# Patient Record
Sex: Female | Born: 1990
Health system: Southern US, Community
[De-identification: ages and names within clinical notes are randomized; demographics above are authoritative.]

## PROBLEM LIST (undated history)

## (undated) DIAGNOSIS — E785 Hyperlipidemia, unspecified: Secondary | ICD-10-CM

## (undated) DIAGNOSIS — E559 Vitamin D deficiency, unspecified: Secondary | ICD-10-CM

## (undated) HISTORY — PX: WISDOM TOOTH EXTRACTION: SHX21

## (undated) HISTORY — DX: Hyperlipidemia, unspecified: E78.5

---

## 2013-03-23 ENCOUNTER — Emergency Department (HOSPITAL_COMMUNITY): Payer: Worker's Compensation

## 2013-03-23 ENCOUNTER — Encounter (HOSPITAL_COMMUNITY): Payer: Self-pay | Admitting: Emergency Medicine

## 2013-03-23 ENCOUNTER — Emergency Department (HOSPITAL_COMMUNITY)
Admission: EM | Admit: 2013-03-23 | Discharge: 2013-03-23 | Disposition: A | Discharge status: Home / Self Care | Payer: Worker's Compensation | Attending: Emergency Medicine | Admitting: Emergency Medicine

## 2013-03-23 DIAGNOSIS — IMO0001 Reserved for inherently not codable concepts without codable children: Secondary | ICD-10-CM | POA: Insufficient documentation

## 2013-03-23 DIAGNOSIS — W5501XA Bitten by cat, initial encounter: Secondary | ICD-10-CM

## 2013-03-23 DIAGNOSIS — Y9389 Activity, other specified: Secondary | ICD-10-CM | POA: Insufficient documentation

## 2013-03-23 DIAGNOSIS — S61209A Unspecified open wound of unspecified finger without damage to nail, initial encounter: Secondary | ICD-10-CM | POA: Insufficient documentation

## 2013-03-23 DIAGNOSIS — Y990 Civilian activity done for income or pay: Secondary | ICD-10-CM | POA: Insufficient documentation

## 2013-03-23 DIAGNOSIS — Y9289 Other specified places as the place of occurrence of the external cause: Secondary | ICD-10-CM | POA: Insufficient documentation

## 2013-03-23 MED ORDER — AMOXICILLIN-POT CLAVULANATE 875-125 MG PO TABS: 1.0000 | ORAL_TABLET | Freq: Two times a day (BID) | ORAL | Status: DC

## 2013-03-23 MED ORDER — TRAMADOL HCL 50 MG PO TABS: 50.0000 mg | ORAL_TABLET | Freq: Four times a day (QID) | ORAL | Status: DC | PRN

## 2013-03-23 MED ORDER — IBUPROFEN 800 MG PO TABS
800.0000 mg | ORAL_TABLET | Freq: Once | ORAL | Status: AC
  Administered 2013-03-23: 800 mg via ORAL
  Filled 2013-03-23: qty 1

## 2013-03-23 MED ORDER — AMOXICILLIN-POT CLAVULANATE 875-125 MG PO TABS
1.0000 | ORAL_TABLET | Freq: Once | ORAL | Status: AC
  Administered 2013-03-23: 1 via ORAL
  Filled 2013-03-23: qty 1

## 2013-03-23 NOTE — ED Provider Notes (Signed)
History    This chart was scribed for non-physician practitioner, Marlon Pel PA-C working with Rolan Bucco, MD by Smitty Pluck, ED scribe. This patient was seen in room WTR5/WTR5 and the patient's care was started at 6:51 PM.   CSN: [id number]  Arrival date & time 03/23/13  1819      Chief Complaint  Patient presents with  . Animal Bite     The history is provided by the patient. No language interpreter was used.   Olivia Gardner is a 22 y.o. female who presents to the Emergency Department complaining of cat bite to right 1st and 2nd digit today. She reports constant, moderate right finger pain. Pt reports that she works at [employer] and she was working with a cat that bite her during a procedure. She reports that the cat does not have any vaccinations. The cat has been placed in quarantine for 10 days. Pt denies fever, chills, nausea, vomiting, diarrhea, weakness, cough, SOB and any other pain.    History reviewed. No pertinent past medical history.  History reviewed. No pertinent past surgical history.  No family history on file.  History  Substance Use Topics  . Smoking status: Not on file  . Smokeless tobacco: Not on file  . Alcohol Use: Not on file    OB History   Grav Para Term Preterm Abortions TAB SAB Ect Mult Living                  Review of Systems  Constitutional: Negative for fever and chills.  Respiratory: Negative for shortness of breath.   Gastrointestinal: Negative for nausea and vomiting.  Neurological: Negative for weakness.    Allergies  Review of patient's allergies indicates no known allergies.  Home Medications   Current Outpatient Rx  Name  Route  Sig  Dispense  Refill  . cetirizine (ZYRTEC) 10 MG tablet   Oral   Take 10 mg by mouth daily.         Marland Kitchen etonogestrel-ethinyl estradiol (NUVARING) 0.12-0.015 MG/24HR vaginal ring   Vaginal   Place 1 each vaginally every 28 (twenty-eight) days. Insert vaginally and leave in place for  3 consecutive weeks, then remove for 1 week.         . naproxen sodium (ANAPROX) 220 MG tablet   Oral   Take 220 mg by mouth as needed. Pain         . simethicone (MYLICON) 80 MG chewable tablet   Oral   Chew 80 mg by mouth every 6 (six) hours as needed for flatulence.         Marland Kitchen amoxicillin-clavulanate (AUGMENTIN) 875-125 MG per tablet   Oral   Take 1 tablet by mouth every 12 (twelve) hours.   14 tablet   0   . traMADol (ULTRAM) 50 MG tablet   Oral   Take 1 tablet (50 mg total) by mouth every 6 (six) hours as needed for pain.   15 tablet   0     BP 103/60  Pulse 98  Temp(Src) 98.7 F (37.1 C) (Oral)  SpO2 97%  LMP 03/16/2013  Physical Exam  Nursing note and vitals reviewed. Constitutional: She is oriented to person, place, and time. She appears well-developed and well-nourished. No distress.  HENT:  Head: Normocephalic and atraumatic.  Eyes: EOM are normal.  Neck: Neck supple. No tracheal deviation present.  Cardiovascular: Normal rate.   Pulmonary/Chest: Effort normal. No respiratory distress.  Musculoskeletal: Normal range of motion.  Left hand: She exhibits tenderness (left thumb) and swelling. She exhibits normal range of motion, no bony tenderness, normal two-point discrimination, normal capillary refill, no deformity and no laceration.       Hands: Neurological: She is alert and oriented to person, place, and time.  Skin: Skin is warm and dry.  Psychiatric: She has a normal mood and affect. Her behavior is normal.    ED Course  Procedures (including critical care time) DIAGNOSTIC STUDIES: Oxygen Saturation is 97% on room air, normal by my interpretation.    COORDINATION OF CARE: 6:54 PM Discussed ED treatment with pt and pt agrees to xray, pain medication and abx.   Augmentin, wound soaked in warm soap and water, xray and pain meds  Labs Reviewed - No data to display Dg Finger Thumb Right  03/23/2013  *RADIOLOGY REPORT*  Clinical Data:  Bitten by a cat at tip of thumb  RIGHT THUMB 2+V  Comparison: None  Findings: Bone mineralization normal. Joint spaces preserved. No fracture, dislocation, or bone destruction. No radiopaque foreign body or soft tissue gas.  IMPRESSION: Normal exam.   Original Report Authenticated By: Ulyses Southward, M.D.      1. Cat bite of hand, left, initial encounter       MDM  Pt has been advised of the symptoms that warrant their return to the ED. Patient has voiced understanding and has agreed to follow-up with the PCP or specialist.  I personally performed the services described in this documentation, which was scribed in my presence. The recorded information has been reviewed and is accurate.    Dorthula Matas, PA-C 03/24/13 2148

## 2013-03-23 NOTE — ED Notes (Signed)
Pt complains of cat bite to right first and second digit.

## 2013-03-25 NOTE — ED Provider Notes (Signed)
Medical screening examination/treatment/procedure(s) were performed by non-physician practitioner and as supervising physician I was immediately available for consultation/collaboration.  Derwood Kaplan, MD 03/25/13 [phone]

## 2015-01-30 ENCOUNTER — Ambulatory Visit (INDEPENDENT_AMBULATORY_CARE_PROVIDER_SITE_OTHER): Payer: Managed Care, Other (non HMO) | Admitting: Physician Assistant

## 2015-01-30 ENCOUNTER — Encounter: Payer: Self-pay | Admitting: Physician Assistant

## 2015-01-30 VITALS — BP 121/76 | HR 74 | Ht 61.0 in | Wt 116.0 lb

## 2015-01-30 DIAGNOSIS — H9319 Tinnitus, unspecified ear: Secondary | ICD-10-CM | POA: Insufficient documentation

## 2015-01-30 DIAGNOSIS — H9313 Tinnitus, bilateral: Secondary | ICD-10-CM | POA: Diagnosis not present

## 2015-01-30 DIAGNOSIS — E7800 Pure hypercholesterolemia, unspecified: Secondary | ICD-10-CM | POA: Insufficient documentation

## 2015-01-30 DIAGNOSIS — M7701 Medial epicondylitis, right elbow: Secondary | ICD-10-CM

## 2015-01-30 DIAGNOSIS — R0602 Shortness of breath: Secondary | ICD-10-CM | POA: Diagnosis not present

## 2015-01-30 DIAGNOSIS — R12 Heartburn: Secondary | ICD-10-CM | POA: Insufficient documentation

## 2015-01-30 DIAGNOSIS — M779 Enthesopathy, unspecified: Secondary | ICD-10-CM | POA: Insufficient documentation

## 2015-01-30 MED ORDER — RANITIDINE HCL 300 MG PO TABS: 300.0000 mg | ORAL_TABLET | Freq: Two times a day (BID) | ORAL | Status: DC

## 2015-01-30 MED ORDER — ALBUTEROL SULFATE 108 (90 BASE) MCG/ACT IN AEPB: 2.0000 | INHALATION_SPRAY | RESPIRATORY_TRACT | Status: DC | PRN

## 2015-01-30 MED ORDER — MONTELUKAST SODIUM 10 MG PO TABS: 10.0000 mg | ORAL_TABLET | Freq: Every day | ORAL | Status: DC

## 2015-01-30 NOTE — Patient Instructions (Addendum)
Zantac for heartburn.  singulair for allergies to use with nasocort Albuterol inhaler as needed for SOb.   Tinnitus Sounds you hear in your ears and coming from within the ear is called tinnitus. This can be a symptom of many ear disorders. It is often associated with hearing loss.  Tinnitus can be seen with:  Infections.  Ear blockages such as wax buildup.  Meniere's disease.  Ear damage.  Inherited.  Occupational causes. While irritating, it is not usually a threat to health. When the cause of the tinnitus is wax, infection in the middle ear, or foreign body it is easily treated. Hearing loss will usually be reversible.  TREATMENT  When treating the underlying cause does not get rid of tinnitus, it may be necessary to get rid of the unwanted sound by covering it up with more pleasant background noises. This may include music, the radio etc. There are tinnitus maskers which can be worn which produce background noise to cover up the tinnitus. Avoid all medications which tend to make tinnitus worse such as alcohol, caffeine, aspirin, and nicotine. There are many soothing background tapes such as rain, ocean, thunderstorms, etc. These soothing sounds help with sleeping or resting. Keep all follow-up appointments and referrals. This is important to identify the cause of the problem. It also helps avoid complications, impaired hearing, disability, or chronic pain. Document Released: 12/12/2005 Document Revised: 03/05/2012 Document Reviewed: 07/30/2008 [facility] Patient Information 2015 [employer] [place]. This information is not intended to replace advice given to you by your health care provider. Make sure you discuss any questions you have with your health care provider.

## 2015-02-02 ENCOUNTER — Encounter: Payer: Self-pay | Admitting: Physician Assistant

## 2015-02-02 DIAGNOSIS — M7700 Medial epicondylitis, unspecified elbow: Secondary | ICD-10-CM | POA: Insufficient documentation

## 2015-02-02 NOTE — Progress Notes (Signed)
   Subjective:    Patient ID: Olivia Gardner, female    DOB: [birth year], 24 y.o.   MRN: [id number]  HPI Pt is a 24 yo female who presents to the clinic to establish care.   . Active Ambulatory Problems    Diagnosis Date Noted  . Tendonitis 01/30/2015  . Elevated LDL cholesterol level 01/30/2015  . Shortness of breath 01/30/2015  . Heart burn 01/30/2015  . Tinnitus 01/30/2015  . Epicondylitis elbow, medial 02/02/2015   Resolved Ambulatory Problems    Diagnosis Date Noted  . No Resolved Ambulatory Problems   No Additional Past Medical History   .History reviewed. No pertinent family history. Marland Kitchen. History   Social History  . Marital Status: Single    Spouse Name: N/A    Number of Children: N/A  . Years of Education: N/A   Occupational History  . Not on file.   Social History Main Topics  . Smoking status: Not on file  . Smokeless tobacco: Not on file  . Alcohol Use: Not on file  . Drug Use: No  . Sexual Activity: Yes   Other Topics Concern  . Not on file   Social History Narrative   Pt does have some ongoing ringing in ears for over a year. Sometimes accompanied by nausea and dizziness. No hearing issues. No pain or fever.   She had occasional heart burn. Takes tums. Mostly when drinks alcohol or eats certain foods.   Pt also has some SOB episodes. Sometimes with exertion sometimes just while watching a movie with boyfriend. No hx of lung issues. Not tried anything to make better. No CP.    Review of Systems  All other systems reviewed and are negative.      Objective:   Physical Exam  Constitutional: She is oriented to person, place, and time. She appears well-developed and well-nourished.  HENT:  Head: Normocephalic and atraumatic.  Right Ear: External ear normal.  Left Ear: External ear normal.  Eyes: Conjunctivae are normal. Right eye exhibits no discharge. Left eye exhibits no discharge.  Neck: Normal range of motion. Neck supple.  Cardiovascular:  Normal rate, regular rhythm and normal heart sounds.   Pulmonary/Chest: Effort normal and breath sounds normal. She has no wheezes.  Neurological: She is alert and oriented to person, place, and time.  Skin: Skin is dry.  Psychiatric: She has a normal mood and affect. Her behavior is normal.          Assessment & Plan:  tinnitis- discussed different causes. Could be allergies as well. Pt does have some dizziness. Needs to have hearing tested and potential evaluated for meneires. Consider zyrtec daily. Per pt will hold off on referral at this time.   Heart burn- zantac as needed since not daily symptoms.   Medial epicondylitis- managed by [facility].   Elevated LDL cholesterol- will wait to get labs. Pt needs cpe and fasting labs.   SOB- peak flows great. Due to episode will give inhaler to try when has episodes of SOB. Does not sound like asthma. Sounds more like dyspnea. Given singulair to start as well. Follow up in 2 months to see if episodes increasing.

## 2015-02-25 ENCOUNTER — Encounter: Payer: Self-pay | Admitting: Physician Assistant

## 2015-02-27 ENCOUNTER — Ambulatory Visit (INDEPENDENT_AMBULATORY_CARE_PROVIDER_SITE_OTHER): Payer: Managed Care, Other (non HMO) | Admitting: Physician Assistant

## 2015-02-27 ENCOUNTER — Encounter: Payer: Self-pay | Admitting: Physician Assistant

## 2015-02-27 VITALS — BP 127/70 | HR 98 | Ht 61.0 in | Wt 118.0 lb

## 2015-02-27 DIAGNOSIS — N920 Excessive and frequent menstruation with regular cycle: Secondary | ICD-10-CM

## 2015-02-27 DIAGNOSIS — Z0000 Encounter for general adult medical examination without abnormal findings: Secondary | ICD-10-CM | POA: Diagnosis not present

## 2015-02-27 DIAGNOSIS — N898 Other specified noninflammatory disorders of vagina: Secondary | ICD-10-CM | POA: Diagnosis not present

## 2015-02-27 MED ORDER — ALBUTEROL SULFATE 108 (90 BASE) MCG/ACT IN AEPB: 2.0000 | INHALATION_SPRAY | RESPIRATORY_TRACT | Status: DC | PRN

## 2015-02-27 MED ORDER — MONTELUKAST SODIUM 10 MG PO TABS: 10.0000 mg | ORAL_TABLET | Freq: Every day | ORAL | Status: DC

## 2015-02-27 NOTE — Patient Instructions (Signed)

## 2015-03-02 NOTE — Progress Notes (Signed)
  Subjective:     Olivia Gardner is a 24 y.o. female and is here for a comprehensive physical exam. The patient reports problems - she has been on nuva ring in the past but recently had some spotting. no pain..pt keeps nuva ring in for 3 months at a time to avoid period. Marland Kitchen.  History   Social History  . Marital Status: Single    Spouse Name: N/A  . Number of Children: N/A  . Years of Education: N/A   Occupational History  . Not on file.   Social History Main Topics  . Smoking status: Never Smoker   . Smokeless tobacco: Not on file  . Alcohol Use: Not on file  . Drug Use: No  . Sexual Activity: Yes   Other Topics Concern  . Not on file   Social History Narrative   Health Maintenance  Topic Date Due  . HIV Screening  11/19/2004  . INFLUENZA VACCINE  07/27/2015  . PAP SMEAR  03/26/2017  . TETANUS/TDAP  12/26/2017    The following portions of the patient's history were reviewed and updated as appropriate: allergies, current medications, past family history, past medical history, past social history, past surgical history and problem list.  Review of Systems A comprehensive review of systems was negative.   Objective:    BP 127/70 mmHg  Pulse 98  Ht 5\' 1"  (1.549 m)  Wt 118 lb (53.524 kg)  BMI 22.31 kg/m2 General appearance: alert, cooperative and appears stated age Head: Normocephalic, without obvious abnormality, atraumatic Eyes: conjunctivae/corneas clear. PERRL, EOM's intact. Fundi benign. Ears: normal TM's and external ear canals both ears Nose: Nares normal. Septum midline. Mucosa normal. No drainage or sinus tenderness. Throat: lips, mucosa, and tongue normal; teeth and gums normal Neck: no adenopathy, no carotid bruit, no JVD, supple, symmetrical, trachea midline and thyroid not enlarged, symmetric, no tenderness/mass/nodules Back: symmetric, no curvature. ROM normal. No CVA tenderness. Lungs: clear to auscultation bilaterally Heart: regular rate and rhythm,  S1, S2 normal, no murmur, click, rub or gallop Abdomen: soft, non-tender; bowel sounds normal; no masses,  no organomegaly Extremities: extremities normal, atraumatic, no cyanosis or edema Pulses: 2+ and symmetric Skin: Skin color, texture, turgor normal. No rashes or lesions Lymph nodes: Cervical, supraclavicular, and axillary nodes normal. Neurologic: Grossly normal    Assessment:    Healthy female exam.      Plan:    CPE- doing great. Pap up to date. Gave pt labs for screening purposes. Vaccines up to date. Encouraged calicum and vitamin D 1200mg  and 800mg . Exercise regularly.   Spotting- has GYN appt next month. Reassuring normal to spot occasionally. Could be multiple contributing factors exercise/stress/nuva ring(has it expired). Bring up to gyn if occuring for another month. Will check TSH.   See After Visit Summary for Counseling Recommendations

## 2015-03-27 ENCOUNTER — Telehealth: Payer: Self-pay | Admitting: *Deleted

## 2015-03-29 ENCOUNTER — Other Ambulatory Visit: Payer: Self-pay | Admitting: Physician Assistant

## 2015-03-30 ENCOUNTER — Other Ambulatory Visit: Payer: Self-pay | Admitting: *Deleted

## 2015-03-30 ENCOUNTER — Other Ambulatory Visit: Payer: Self-pay | Admitting: Physician Assistant

## 2015-03-30 ENCOUNTER — Telehealth: Payer: Self-pay | Admitting: *Deleted

## 2015-03-30 DIAGNOSIS — Z0000 Encounter for general adult medical examination without abnormal findings: Secondary | ICD-10-CM

## 2015-03-30 DIAGNOSIS — N920 Excessive and frequent menstruation with regular cycle: Secondary | ICD-10-CM

## 2015-03-30 MED ORDER — PERMETHRIN 5 % EX CREA: 1.0000 "application " | TOPICAL_CREAM | Freq: Once | CUTANEOUS | Status: DC

## 2015-03-30 MED ORDER — AMBULATORY NON FORMULARY MEDICATION: Status: DC

## 2015-03-30 NOTE — Telephone Encounter (Signed)
Ok to send permethrin topical apply 5 percent cream to whole body for 8-14 hours then was off. No refills.

## 2015-03-30 NOTE — Telephone Encounter (Signed)
LABS ORDERED AND FAXED TO [facility] PER PT REQUEST.

## 2015-03-30 NOTE — Telephone Encounter (Signed)
Patient would like a RX for reaction to dog: scabies/mange. Boyfriend brought home a rescue dog that was diagnosis by the [facility] W/ dog version of scabies/mange & she is having itchy bumps.

## 2015-03-31 ENCOUNTER — Telehealth: Payer: Self-pay | Admitting: *Deleted

## 2015-03-31 NOTE — Telephone Encounter (Signed)
closed

## 2015-03-31 NOTE — Telephone Encounter (Signed)
Patient called & concerned about the doggie scabies & if her skin Would be ok to get a tattoo on Friday. Per Lesly RubensteinJade as long as she has no flare up Or open scales she would be ok. But it's totally her call.  **Patient did call back & said she never had any real flare up just a few bumps on her belly.

## 2015-04-30 ENCOUNTER — Other Ambulatory Visit: Payer: Self-pay | Admitting: Physician Assistant

## 2015-04-30 MED ORDER — MONTELUKAST SODIUM 10 MG PO TABS: 10.0000 mg | ORAL_TABLET | Freq: Every day | ORAL | Status: DC

## 2015-05-01 ENCOUNTER — Telehealth: Payer: Self-pay | Admitting: Physician Assistant

## 2015-05-01 NOTE — Telephone Encounter (Signed)
Got labs from outside source: LDL 162, TG elevated at 164 up from 2015. No other risk factors but we certainly want to decrease this number. I think a statin would be a wise choice to start. Can I send over and recheck levels in 1 year. Risk factors are low.

## 2015-05-01 NOTE — Telephone Encounter (Signed)
Left message for a return call

## 2015-05-04 NOTE — Telephone Encounter (Signed)
Patient returned clinic call regarding lab values. She would like to know if there is another option such as diet/exercise rather than beginning a medication. If Rx is sent in, Pt prefers use of Mail Order Pharmacy on file.  Please advise, thank you.

## 2015-05-04 NOTE — Telephone Encounter (Signed)
Patient is going to think about starting a new Rx. Would like to know which Rx you are thinking would be best for her. Please advise. Thank you.

## 2015-05-04 NOTE — Telephone Encounter (Signed)
Left a message for a return call.

## 2015-05-04 NOTE — Telephone Encounter (Signed)
Certainly diet and exercise can help: however, pt's LDL has increased from 136-164 in one year. No risk factors so if you did want to given 6 months of low fat diet and exercise you could. Fish oil can help TG but not usually lipids very much.

## 2015-05-04 NOTE — Telephone Encounter (Signed)
I was thinking pravastatin 40mg  or atorvastatin 10mg . A moderate intensity statin.

## 2015-05-26 ENCOUNTER — Encounter: Payer: Self-pay | Admitting: Physician Assistant

## 2015-08-05 ENCOUNTER — Telehealth: Payer: Self-pay | Admitting: Physician Assistant

## 2015-08-05 DIAGNOSIS — H9313 Tinnitus, bilateral: Secondary | ICD-10-CM

## 2015-08-05 NOTE — Telephone Encounter (Signed)
Pt called for referral to an ENT specialist.  Please see Olivia Gardner's notes regarding referral.  Thank you

## 2015-08-06 NOTE — Telephone Encounter (Signed)
Referral ordered

## 2015-10-20 NOTE — Telephone Encounter (Signed)
Did pt decide?

## 2016-01-19 ENCOUNTER — Other Ambulatory Visit: Payer: Self-pay | Admitting: Physician Assistant

## 2016-03-25 ENCOUNTER — Ambulatory Visit (INDEPENDENT_AMBULATORY_CARE_PROVIDER_SITE_OTHER): Payer: Managed Care, Other (non HMO) | Admitting: Physician Assistant

## 2016-03-25 ENCOUNTER — Encounter: Payer: Self-pay | Admitting: Physician Assistant

## 2016-03-25 VITALS — BP 111/88 | HR 67 | Ht 61.0 in | Wt 114.0 lb

## 2016-03-25 DIAGNOSIS — E785 Hyperlipidemia, unspecified: Secondary | ICD-10-CM | POA: Insufficient documentation

## 2016-03-25 DIAGNOSIS — R5383 Other fatigue: Secondary | ICD-10-CM | POA: Insufficient documentation

## 2016-03-25 DIAGNOSIS — Z13220 Encounter for screening for lipoid disorders: Secondary | ICD-10-CM | POA: Diagnosis not present

## 2016-03-25 DIAGNOSIS — M67431 Ganglion, right wrist: Secondary | ICD-10-CM

## 2016-03-25 DIAGNOSIS — Z131 Encounter for screening for diabetes mellitus: Secondary | ICD-10-CM

## 2016-03-25 DIAGNOSIS — Z0000 Encounter for general adult medical examination without abnormal findings: Secondary | ICD-10-CM

## 2016-03-25 LAB — LIPID PANEL
Cholesterol: 228 mg/dL — AB (ref 0–200)
HDL: 80 mg/dL — AB (ref 35–70)
LDL Cholesterol: 132 mg/dL
Triglycerides: 78 mg/dL (ref 40–160)

## 2016-03-25 LAB — FERRITIN: Ferritin: 11

## 2016-03-25 LAB — VITAMIN B12: VITAMIN B12: 362

## 2016-03-25 LAB — TSH: TSH: 3.62 u[IU]/mL (ref 0.41–5.90)

## 2016-03-25 LAB — CBC AND DIFFERENTIAL
HCT: 40 % (ref 36–46)
Hemoglobin: 12.6 g/dL (ref 12.0–16.0)
Platelets: 205 K/µL (ref 150–399)
WBC: 5.1 10*3/mL

## 2016-03-25 LAB — VITAMIN D 25 HYDROXY (VIT D DEFICIENCY, FRACTURES): VIT D 25 HYDROXY: 16.1

## 2016-03-25 MED ORDER — MELOXICAM 15 MG PO TABS: 15.0000 mg | ORAL_TABLET | Freq: Every day | ORAL | Status: DC

## 2016-03-25 NOTE — Patient Instructions (Signed)

## 2016-03-28 ENCOUNTER — Telehealth: Payer: Self-pay | Admitting: Physician Assistant

## 2016-03-28 ENCOUNTER — Encounter: Payer: Self-pay | Admitting: Physician Assistant

## 2016-03-28 DIAGNOSIS — E559 Vitamin D deficiency, unspecified: Secondary | ICD-10-CM | POA: Insufficient documentation

## 2016-03-28 DIAGNOSIS — R790 Abnormal level of blood mineral: Secondary | ICD-10-CM | POA: Insufficient documentation

## 2016-03-28 MED ORDER — FERROUS SULFATE 325 (65 FE) MG PO TBEC
325.0000 mg | DELAYED_RELEASE_TABLET | Freq: Every day | ORAL | Status: DC
Start: 2016-03-28 — End: 2019-04-03

## 2016-03-28 MED ORDER — VITAMIN D (ERGOCALCIFEROL) 1.25 MG (50000 UNIT) PO CAPS: 50000.0000 [IU] | ORAL_CAPSULE | ORAL | Status: DC

## 2016-03-28 NOTE — Telephone Encounter (Signed)
Call pt: LDL is 132 a little elevated but cardiac risk still low. HDL great. Continue to work on low fat diet.  Thyroid normal.  Vitamin d is low. Start 50,000 weekly for 3 months and recheck. b12 a little low as well b12 1000mcg daily.  Iron stores low start ferrous sulfate 325mg  once daily.

## 2016-03-28 NOTE — Progress Notes (Signed)
  Subjective:     Olivia Gardner is a 25 y.o. female and is here for a comprehensive physical exam. The patient reports problems - she has been dx with ganglion cyst of right wrist by ortho in [facility] and referred to hand surgeon. they are painful. she would like to go to one [facility]. she does want some bloodwork. she has no energy. she denies any depression or feelings of hopelessness or helplessness..  Social History   Social History  . Marital Status: Single    Spouse Name: N/A  . Number of Children: N/A  . Years of Education: N/A   Occupational History  . Not on file.   Social History Main Topics  . Smoking status: Never Smoker   . Smokeless tobacco: Not on file  . Alcohol Use: Not on file  . Drug Use: No  . Sexual Activity: Yes   Other Topics Concern  . Not on file   Social History Narrative   Health Maintenance  Topic Date Due  . INFLUENZA VACCINE  07/26/2016  . PAP SMEAR  03/26/2017  . TETANUS/TDAP  12/26/2017  . HIV Screening  Completed    The following portions of the patient's history were reviewed and updated as appropriate: allergies, current medications, past family history, past medical history, past social history, past surgical history and problem list.  Review of Systems Pertinent items noted in HPI and remainder of comprehensive ROS otherwise negative.   Objective:    BP 111/88 mmHg  Pulse 67  Ht 5\' 1"  (1.549 m)  Wt 114 lb (51.71 kg)  BMI 21.55 kg/m2 General appearance: alert, cooperative and appears stated age Head: Normocephalic, without obvious abnormality, atraumatic Eyes: conjunctivae/corneas clear. PERRL, EOM's intact. Fundi benign. Ears: normal TM's and external ear canals both ears Nose: Nares normal. Septum midline. Mucosa normal. No drainage or sinus tenderness. Throat: lips, mucosa, and tongue normal; teeth and gums normal Neck: no adenopathy, no carotid bruit, no JVD, supple, symmetrical, trachea midline and thyroid not  enlarged, symmetric, no tenderness/mass/nodules Back: symmetric, no curvature. ROM normal. No CVA tenderness. Lungs: clear to auscultation bilaterally Heart: regular rate and rhythm, S1, S2 normal, no murmur, click, rub or gallop Abdomen: soft, non-tender; bowel sounds normal; no masses,  no organomegaly Extremities: extremities normal, atraumatic, no cyanosis or edema Pulses: 2+ and symmetric Skin: Skin color, texture, turgor normal. No rashes or lesions Lymph nodes: Cervical, supraclavicular, and axillary nodes normal. Neurologic: Grossly normal    Assessment:    Healthy female exam.      Plan:    CPE/hyperlipidemia- labs ordered. Pap done at GYN. Vaccines up to date. Dicussed calcium 1500mg  and vitamin D 800 units.   No energy- faitgue panel ordered. Do not think depression. PHQ-2 was 0.    Right ganglion cyst- referral made to hand surgeon. Filled out paperwork for intermittent leave to make appts.  See After Visit Summary for Counseling Recommendations

## 2016-03-29 NOTE — Telephone Encounter (Signed)
Pt notified of all results & recommendations. 

## 2016-03-30 ENCOUNTER — Encounter: Payer: Self-pay | Admitting: Physician Assistant

## 2016-04-19 ENCOUNTER — Encounter: Payer: Self-pay | Admitting: Physician Assistant

## 2016-04-19 ENCOUNTER — Ambulatory Visit (INDEPENDENT_AMBULATORY_CARE_PROVIDER_SITE_OTHER): Payer: Managed Care, Other (non HMO) | Admitting: Physician Assistant

## 2016-04-19 VITALS — BP 106/54 | HR 69 | Ht 61.0 in | Wt 119.0 lb

## 2016-04-19 DIAGNOSIS — M779 Enthesopathy, unspecified: Secondary | ICD-10-CM | POA: Diagnosis not present

## 2016-04-19 DIAGNOSIS — M67431 Ganglion, right wrist: Secondary | ICD-10-CM | POA: Diagnosis not present

## 2016-04-20 ENCOUNTER — Encounter: Payer: Self-pay | Admitting: Physician Assistant

## 2016-04-20 NOTE — Progress Notes (Signed)
   Subjective:    Patient ID: Olivia Gardner, female    DOB: Dec 02, 1991, 25 y.o.   MRN: [id number]  HPI Pt is a 25 yo female with right ganglion cyst that she is seeing a Hydrographic surveyorhand surgeon for. She was scheduled with [facility] provider but she decided to go with [facility]. She has appt next week. She needs FMLA paperwork filled out for intermittent leave due to office visits.    Review of Systems  All other systems reviewed and are negative.      Objective:   Physical Exam  Constitutional: She appears well-developed and well-nourished.  Psychiatric: She has a normal mood and affect. Her behavior is normal.          Assessment & Plan:  Ganglion of right wrist- FMLA paperwork filled out for intermittent leave no total 4 times a month for 4 hours at a time to accommodate surgeon appts and pre/post op follow ups. If she will need any continuous time after surgery surgeon can fill out FMLA.

## 2016-05-21 ENCOUNTER — Other Ambulatory Visit: Payer: Self-pay | Admitting: Physician Assistant

## 2016-06-18 ENCOUNTER — Other Ambulatory Visit: Payer: Self-pay | Admitting: Physician Assistant

## 2018-04-21 LAB — HM PAP SMEAR

## 2018-05-23 ENCOUNTER — Encounter: Payer: Self-pay | Admitting: Physician Assistant

## 2018-07-26 ENCOUNTER — Encounter: Payer: Managed Care, Other (non HMO) | Admitting: Obstetrics & Gynecology

## 2018-08-28 ENCOUNTER — Encounter: Payer: Self-pay | Admitting: Obstetrics & Gynecology

## 2018-08-28 ENCOUNTER — Ambulatory Visit (INDEPENDENT_AMBULATORY_CARE_PROVIDER_SITE_OTHER): Payer: BLUE CROSS/BLUE SHIELD | Admitting: Obstetrics & Gynecology

## 2018-08-28 VITALS — BP 108/70 | HR 80 | Wt 125.0 lb

## 2018-08-28 DIAGNOSIS — Z3400 Encounter for supervision of normal first pregnancy, unspecified trimester: Secondary | ICD-10-CM | POA: Diagnosis not present

## 2018-08-28 DIAGNOSIS — Z3687 Encounter for antenatal screening for uncertain dates: Secondary | ICD-10-CM | POA: Diagnosis not present

## 2018-08-28 DIAGNOSIS — Z3401 Encounter for supervision of normal first pregnancy, first trimester: Secondary | ICD-10-CM

## 2018-08-28 DIAGNOSIS — Z113 Encounter for screening for infections with a predominantly sexual mode of transmission: Secondary | ICD-10-CM | POA: Diagnosis not present

## 2018-08-28 NOTE — Progress Notes (Signed)
DATING AND VIABILITY SONOGRAM   Olivia Gardner is a 27 y.o. year old G1P0 with LMP Patient's last menstrual period was 06/18/2018 (exact date). which would correlate to  [redacted]w[redacted]d weeks gestation.  She has regular menstrual cycles.   She is here today for a confirmatory initial sonogram.    GESTATION: SINGLETON  FETAL ACTIVITY:          Heart rate       165          The fetus is active.   ADNEXA: The ovaries are normal.   GESTATIONAL AGE AND  BIOMETRICS:  Gestational criteria: Estimated Date of Delivery: 03/25/19 by LMP now at [redacted]w[redacted]d  Previous Scans:0  GESTATIONAL SAC         cm         10 weeks  CROWN RUMP LENGTH         3.23  cm       10-2 weeks                                                                               AVERAGE EGA(BY THIS SCAN):  10-1weeks  WORKING EDD( early ultrasound ):  10-1 weeks     Armandina Stammer 08/28/2018 4:13 PM

## 2018-08-28 NOTE — Progress Notes (Signed)
  Subjective:    Olivia Gardner is a G1P0 [redacted]w[redacted]d being seen today for her first obstetrical visit.  Her obstetrical history is significant for good health and normal prepregnancy weight.. Patient does intend to breast feed. Pregnancy history fully reviewed.  Patient reports mild nausea and some food aversions..  Vitals:   08/28/18 1548  BP: 108/70  Pulse: 80  Weight: 125 lb (56.7 kg)    HISTORY: OB History  Gravida Para Term Preterm AB Living  1            SAB TAB Ectopic Multiple Live Births               # Outcome Date GA Lbr Len/2nd Weight Sex Delivery Anes PTL Lv  1 Current            Past Medical History:  Diagnosis Date  . Hyperlipidemia    Past Surgical History:  Procedure Laterality Date  . WISDOM TOOTH EXTRACTION     Family History  Problem Relation Age of Onset  . Diabetes Maternal Grandmother   . Diabetes Paternal Grandmother   . Asthma Neg Hx   . Stroke Neg Hx      Exam    Uterus:     Pelvic Exam:    Perineum: No Hemorrhoids   Vulva: normal   Vagina:  normal mucosa   pH:    Cervix: no cervical motion tenderness and no lesions   Adnexa: normal adnexa   Bony Pelvis: average  System: Breast:  normal appearance, no masses or tenderness   Skin: normal coloration and turgor, no rashes    Neurologic: oriented, normal mood   Extremities: no deformities   HEENT extra ocular movement intact, sclera clear, anicteric, oropharynx clear, no lesions, neck supple with midline trachea, thyroid without masses and trachea midline   Mouth/Teeth mucous membranes moist, pharynx normal without lesions and dental hygiene good   Neck supple and no masses   Cardiovascular: regular rate and rhythm   Respiratory:  appears well, vitals normal, no respiratory distress, acyanotic, normal RR, chest clear, no wheezing, crepitations, rhonchi, normal symmetric air entry   Abdomen: soft, non-tender; bowel sounds normal; no masses,  no organomegaly   Urinary: urethral meatus  normal      Assessment:    Pregnancy: G1P0 Patient Active Problem List   Diagnosis Date Noted  . Vitamin D deficiency 03/28/2016  . Low iron stores 03/28/2016  . No energy 03/25/2016  . Hyperlipidemia 03/25/2016  . Ganglion of right wrist 03/25/2016  . Spotting 03/30/2015  . Epicondylitis elbow, medial 02/02/2015  . Tendonitis 01/30/2015  . Elevated LDL cholesterol level 01/30/2015  . Shortness of breath 01/30/2015  . Heart burn 01/30/2015  . Tinnitus 01/30/2015        Plan:     Initial labs drawn. Prenatal vitamins. Problem list reviewed and updated. Genetic Screening discussed:  Panorama and AFP at 15 weeks Ultrasound discussed; fetal survey: requested.  Follow up in 5 weeks.  Babyscripts optimized schedule Constipation--colace and Miralax prn Exercise counseling and recommendations Flu shot before October  Olivia Gardner 08/28/2018

## 2018-08-29 LAB — OBSTETRIC PANEL
Antibody Screen: NOT DETECTED
Basophils Absolute: 36 cells/uL (ref 0–200)
Basophils Relative: 0.3 %
Eosinophils Absolute: 95 cells/uL (ref 15–500)
Eosinophils Relative: 0.8 %
HCT: 38.5 % (ref 35.0–45.0)
HEMOGLOBIN: 13.2 g/dL (ref 11.7–15.5)
HEP B S AG: NONREACTIVE
Lymphs Abs: 2392 cells/uL (ref 850–3900)
MCH: 29.9 pg (ref 27.0–33.0)
MCHC: 34.3 g/dL (ref 32.0–36.0)
MCV: 87.1 fL (ref 80.0–100.0)
MONOS PCT: 7.4 %
MPV: 11 fL (ref 7.5–12.5)
Neutro Abs: 8497 cells/uL — ABNORMAL HIGH (ref 1500–7800)
Neutrophils Relative %: 71.4 %
Platelets: 220 10*3/uL (ref 140–400)
RBC: 4.42 10*6/uL (ref 3.80–5.10)
RDW: 15.8 % — ABNORMAL HIGH (ref 11.0–15.0)
RPR Ser Ql: NONREACTIVE
TOTAL LYMPHOCYTE: 20.1 %
WBC mixed population: 881 cells/uL (ref 200–950)
WBC: 11.9 10*3/uL — AB (ref 3.8–10.8)

## 2018-08-29 LAB — CERVICOVAGINAL ANCILLARY ONLY
CHLAMYDIA, DNA PROBE: NEGATIVE
NEISSERIA GONORRHEA: NEGATIVE

## 2018-08-29 LAB — HIV ANTIBODY (ROUTINE TESTING W REFLEX): HIV 1&2 Ab, 4th Generation: NONREACTIVE

## 2018-08-30 LAB — URINE CULTURE, OB REFLEX

## 2018-08-30 LAB — CULTURE, OB URINE

## 2018-09-01 ENCOUNTER — Encounter: Payer: Self-pay | Admitting: Obstetrics & Gynecology

## 2018-09-01 DIAGNOSIS — O9989 Other specified diseases and conditions complicating pregnancy, childbirth and the puerperium: Secondary | ICD-10-CM

## 2018-09-01 DIAGNOSIS — Z2839 Other underimmunization status: Secondary | ICD-10-CM | POA: Insufficient documentation

## 2018-09-01 DIAGNOSIS — Z283 Underimmunization status: Secondary | ICD-10-CM | POA: Insufficient documentation

## 2018-09-04 ENCOUNTER — Other Ambulatory Visit: Payer: Self-pay

## 2018-10-02 ENCOUNTER — Ambulatory Visit (INDEPENDENT_AMBULATORY_CARE_PROVIDER_SITE_OTHER): Payer: BLUE CROSS/BLUE SHIELD | Admitting: Advanced Practice Midwife

## 2018-10-02 VITALS — BP 114/71 | HR 95 | Wt 127.0 lb

## 2018-10-02 DIAGNOSIS — Z3400 Encounter for supervision of normal first pregnancy, unspecified trimester: Secondary | ICD-10-CM

## 2018-10-02 DIAGNOSIS — Z23 Encounter for immunization: Secondary | ICD-10-CM | POA: Diagnosis not present

## 2018-10-02 DIAGNOSIS — Z3480 Encounter for supervision of other normal pregnancy, unspecified trimester: Secondary | ICD-10-CM | POA: Diagnosis not present

## 2018-10-02 DIAGNOSIS — Z3402 Encounter for supervision of normal first pregnancy, second trimester: Secondary | ICD-10-CM

## 2018-10-02 NOTE — Progress Notes (Signed)
   PRENATAL VISIT NOTE  Subjective:  Olivia Gardner is a 27 y.o. G1P0 at [redacted]w[redacted]d being seen today for ongoing prenatal care.  She is currently monitored for the following issues for this low-risk pregnancy and has Tendonitis; Elevated LDL cholesterol level; Shortness of breath; Heart burn; Tinnitus; Epicondylitis elbow, medial; Spotting; No energy; Hyperlipidemia; Ganglion of right wrist; Vitamin D deficiency; Low iron stores; Supervision of low-risk first pregnancy; and Rubella non-immune status, antepartum on their problem list.  Patient reports no complaints.   .  .   . Denies leaking of fluid.   The following portions of the patient's history were reviewed and updated as appropriate: allergies, current medications, past family history, past medical history, past social history, past surgical history and problem list. Problem list updated.  Objective:  There were no vitals filed for this visit.  Fetal Status:           General:  Alert, oriented and cooperative. Patient is in no acute distress.  Skin: Skin is warm and dry. No rash noted.   Cardiovascular: Normal heart rate noted  Respiratory: Normal respiratory effort, no problems with respiration noted  Abdomen: Soft, gravid, appropriate for gestational age.        Pelvic: Cervical exam deferred        Extremities: Normal range of motion.     Mental Status: Normal mood and affect. Normal behavior. Normal judgment and thought content.   Assessment and Plan:  Pregnancy: G1P0 at [redacted]w[redacted]d  1. Encounter for supervision of low-risk first pregnancy, antepartum --Anticipatory guidance about next visits/weeks of pregnancy given. --Questions answered.  Pt unsure of birth plans (meds vs natural) but wants to discuss at future visits.  Recommend she write down her preferences and bring them in.   --Recommend childbirth classes --Anatomy [place] ordered --Next visit at 20 weeks on Baby Rx  Preterm labor symptoms and general obstetric precautions  including but not limited to vaginal bleeding, contractions, leaking of fluid and fetal movement were reviewed in detail with the patient. Please refer to After Visit Summary for other counseling recommendations.  No follow-ups on file.  Future Appointments  Date Time Provider Department Center  10/02/2018  3:45 PM Leftwich-Kirby, Wilmer Floor, CNM [facility]    Sharen Counter, CNM

## 2018-10-02 NOTE — Patient Instructions (Addendum)
For information: [email or web address] [employer] classes at [facility]   Second Trimester of Pregnancy The second trimester is from week 14 through week 27 (months 4 through 6). The second trimester is often a time when you feel your best. Your body has adjusted to being pregnant, and you begin to feel better physically. Usually, morning sickness has lessened or quit completely, you may have more energy, and you may have an increase in appetite. The second trimester is also a time when the fetus is growing rapidly. At the end of the sixth month, the fetus is about 9 inches long and weighs about 1 pounds. You will likely begin to feel the baby move (quickening) between 16 and 20 weeks of pregnancy. Body changes during your second trimester Your body continues to go through many changes during your second trimester. The changes vary from woman to woman.  Your weight will continue to increase. You will notice your lower abdomen bulging out.  You may begin to get stretch marks on your hips, abdomen, and breasts.  You may develop headaches that can be relieved by medicines. The medicines should be approved by your health care provider.  You may urinate more often because the fetus is pressing on your bladder.  You may develop or continue to have heartburn as a result of your pregnancy.  You may develop constipation because certain hormones are causing the muscles that push waste through your intestines to slow down.  You may develop hemorrhoids or swollen, bulging veins (varicose veins).  You may have back pain. This is caused by: ? Weight gain. ? Pregnancy hormones that are relaxing the joints in your pelvis. ? A shift in weight and the muscles that support your balance.  Your breasts will continue to grow and they will continue to become tender.  Your gums may bleed and may be sensitive to brushing and flossing.  Dark spots or blotches (chloasma, mask of pregnancy) may  develop on your face. This will likely fade after the baby is born.  A dark line from your belly button to the pubic area (linea nigra) may appear. This will likely fade after the baby is born.  You may have changes in your hair. These can include thickening of your hair, rapid growth, and changes in texture. Some women also have hair loss during or after pregnancy, or hair that feels dry or thin. Your hair will most likely return to normal after your baby is born.  What to expect at prenatal visits During a routine prenatal visit:  You will be weighed to make sure you and the fetus are growing normally.  Your blood pressure will be taken.  Your abdomen will be measured to track your baby's growth.  The fetal heartbeat will be listened to.  Any test results from the previous visit will be discussed.  Your health care provider may ask you:  How you are feeling.  If you are feeling the baby move.  If you have had any abnormal symptoms, such as leaking fluid, bleeding, severe headaches, or abdominal cramping.  If you are using any tobacco products, including cigarettes, chewing tobacco, and electronic cigarettes.  If you have any questions.  Other tests that may be performed during your second trimester include:  Blood tests that check for: ? Low iron levels (anemia). ? High blood sugar that affects pregnant women (gestational diabetes) between 58 and 28 weeks. ? Rh antibodies. This is to check for a protein on red blood  cells (Rh factor).  Urine tests to check for infections, diabetes, or protein in the urine.  An ultrasound to confirm the proper growth and development of the baby.  An amniocentesis to check for possible genetic problems.  Fetal screens for spina bifida and Down syndrome.  HIV (human immunodeficiency virus) testing. Routine prenatal testing includes screening for HIV, unless you choose not to have this test.  Follow these instructions at  home: Medicines  Follow your health care provider's instructions regarding medicine use. Specific medicines may be either safe or unsafe to take during pregnancy.  Take a prenatal vitamin that contains at least 600 micrograms (mcg) of folic acid.  If you develop constipation, try taking a stool softener if your health care provider approves. Eating and drinking  Eat a balanced diet that includes fresh fruits and vegetables, whole grains, good sources of protein such as meat, eggs, or tofu, and low-fat dairy. Your health care provider will help you determine the amount of weight gain that is right for you.  Avoid raw meat and uncooked cheese. These carry germs that can cause birth defects in the baby.  If you have low calcium intake from food, talk to your health care provider about whether you should take a daily calcium supplement.  Limit foods that are high in fat and processed sugars, such as fried and sweet foods.  To prevent constipation: ? Drink enough fluid to keep your urine clear or pale yellow. ? Eat foods that are high in fiber, such as fresh fruits and vegetables, whole grains, and beans. Activity  Exercise only as directed by your health care provider. Most women can continue their usual exercise routine during pregnancy. Try to exercise for 30 minutes at least 5 days a week. Stop exercising if you experience uterine contractions.  Avoid heavy lifting, wear low heel shoes, and practice good posture.  A sexual relationship may be continued unless your health care provider directs you otherwise. Relieving pain and discomfort  Wear a good support bra to prevent discomfort from breast tenderness.  Take warm sitz baths to soothe any pain or discomfort caused by hemorrhoids. Use hemorrhoid cream if your health care provider approves.  Rest with your legs elevated if you have leg cramps or low back pain.  If you develop varicose veins, wear support hose. Elevate your feet  for 15 minutes, 3-4 times a day. Limit salt in your diet. Prenatal Care  Write down your questions. Take them to your prenatal visits.  Keep all your prenatal visits as told by your health care provider. This is important. Safety  Wear your seat belt at all times when driving.  Make a list of emergency phone numbers, including numbers for family, friends, the hospital, and police and fire departments. General instructions  Ask your health care provider for a referral to a local prenatal education class. Begin classes no later than the beginning of month 6 of your pregnancy.  Ask for help if you have counseling or nutritional needs during pregnancy. Your health care provider can offer advice or refer you to specialists for help with various needs.  Do not use hot tubs, steam rooms, or saunas.  Do not douche or use tampons or scented sanitary pads.  Do not cross your legs for long periods of time.  Avoid cat litter boxes and soil used by cats. These carry germs that can cause birth defects in the baby and possibly loss of the fetus by miscarriage or stillbirth.  Avoid  all smoking, herbs, alcohol, and unprescribed drugs. Chemicals in these products can affect the formation and growth of the baby.  Do not use any products that contain nicotine or tobacco, such as cigarettes and e-cigarettes. If you need help quitting, ask your health care provider.  Visit your dentist if you have not gone yet during your pregnancy. Use a soft toothbrush to brush your teeth and be gentle when you floss. Contact a health care provider if:  You have dizziness.  You have mild pelvic cramps, pelvic pressure, or nagging pain in the abdominal area.  You have persistent nausea, vomiting, or diarrhea.  You have a bad smelling vaginal discharge.  You have pain when you urinate. Get help right away if:  You have a fever.  You are leaking fluid from your vagina.  You have spotting or bleeding from your  vagina.  You have severe abdominal cramping or pain.  You have rapid weight gain or weight loss.  You have shortness of breath with chest pain.  You notice sudden or extreme swelling of your face, hands, ankles, feet, or legs.  You have not felt your baby move in over an hour.  You have severe headaches that do not go away when you take medicine.  You have vision changes. Summary  The second trimester is from week 14 through week 27 (months 4 through 6). It is also a time when the fetus is growing rapidly.  Your body goes through many changes during pregnancy. The changes vary from woman to woman.  Avoid all smoking, herbs, alcohol, and unprescribed drugs. These chemicals affect the formation and growth your baby.  Do not use any tobacco products, such as cigarettes, chewing tobacco, and e-cigarettes. If you need help quitting, ask your health care provider.  Contact your health care provider if you have any questions. Keep all prenatal visits as told by your health care provider. This is important. This information is not intended to replace advice given to you by your health care provider. Make sure you discuss any questions you have with your health care provider. Document Released: 12/06/2001 Document Revised: 01/17/2017 Document Reviewed: 01/17/2017 Elsevier Interactive Patient Education  [employer].

## 2018-10-08 ENCOUNTER — Encounter: Payer: Self-pay | Admitting: *Deleted

## 2018-10-26 ENCOUNTER — Other Ambulatory Visit: Payer: Self-pay

## 2018-10-26 ENCOUNTER — Encounter: Payer: Self-pay | Admitting: *Deleted

## 2018-10-26 ENCOUNTER — Emergency Department
Admission: EM | Admit: 2018-10-26 | Discharge: 2018-10-26 | Disposition: A | Discharge status: Home / Self Care | Payer: BLUE CROSS/BLUE SHIELD | Source: Home / Self Care | Attending: Family Medicine | Admitting: Family Medicine

## 2018-10-26 ENCOUNTER — Encounter (HOSPITAL_COMMUNITY): Payer: Self-pay

## 2018-10-26 DIAGNOSIS — J029 Acute pharyngitis, unspecified: Secondary | ICD-10-CM | POA: Diagnosis not present

## 2018-10-26 LAB — POCT RAPID STREP A (OFFICE): Rapid Strep A Screen: NEGATIVE

## 2018-10-26 NOTE — ED Provider Notes (Signed)
[facility]    CSN: [id number] Arrival date & time: 10/26/18  1544     History   Chief Complaint Chief Complaint  Patient presents with  . Sore Throat    HPI Myrical Andujo is a 27 y.o. female.   HPI Elmarie Shiley Voisin is a 27 y.o. female presenting to UC with c/o sore throat and bilateral ear pain with body aches and generalized headache that started this morning. 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 Pt is [redacted] weeks pregnant. No specific known sick contacts. She took Claritin for nasal congestion with mild relief. Denies cough.   Past Medical History:  Diagnosis Date  . Hyperlipidemia     Patient Active Problem List   Diagnosis Date Noted  . Rubella non-immune status, antepartum 09/01/2018  . Supervision of low-risk first pregnancy 08/28/2018  . Vitamin D deficiency 03/28/2016  . Low iron stores 03/28/2016  . No energy 03/25/2016  . Hyperlipidemia 03/25/2016  . Ganglion of right wrist 03/25/2016  . Spotting 03/30/2015  . Epicondylitis elbow, medial 02/02/2015  . Tendonitis 01/30/2015  . Elevated LDL cholesterol level 01/30/2015  . Shortness of breath 01/30/2015  . Heart burn 01/30/2015  . Tinnitus 01/30/2015    Past Surgical History:  Procedure Laterality Date  . WISDOM TOOTH EXTRACTION      OB History    Gravida  1   Para      Term      Preterm      AB      Living        SAB      TAB      Ectopic      Multiple      Live Births               Home Medications    Prior to Admission medications   Medication Sig Start Date End Date Taking? Authorizing Provider  loratadine (CLARITIN) 10 MG tablet Take 10 mg by mouth daily.   Yes [provider]  BIOTIN PO Take by mouth.    [provider]  ferrous sulfate 325 (65 FE) MG EC tablet Take 1 tablet (325 mg total) by mouth daily with breakfast. 03/28/16   Breeback, Jade L, PA-C  Omega-3 Fatty Acids (SALMON OIL PO) Take by  mouth.    [provider]  Prenatal Vit-Fe Fumarate-FA (PRENATAL VITAMIN PO) Take by mouth.    [provider]  Vitamin D, Ergocalciferol, (DRISDOL) 50000 units CAPS capsule Take 1 capsule (50,000 Units total) by mouth every 7 (seven) days. 03/28/16   Jomarie Longs, PA-C    Family History Family History  Problem Relation Age of Onset  . Hyperlipidemia Mother   . Hypertension Father   . Diabetes Maternal Grandmother   . Diabetes Paternal Grandmother   . Asthma Neg Hx   . Stroke Neg Hx     Social History Social History   Tobacco Use  . Smoking status: Never Smoker  . Smokeless tobacco: Never Used  Substance Use Topics  . Alcohol use: Not Currently    Alcohol/week: 0.0 standard drinks  . Drug use: No     Allergies   Patient has no known allergies.   Review of Systems Review of Systems  Constitutional: Negative for appetite change, fatigue and fever.  HENT: Positive for congestion, ear pain, sneezing and sore throat.   Gastrointestinal: Negative for abdominal pain, diarrhea, nausea and vomiting.  Musculoskeletal:  Body aches  Skin: Negative for rash.  Neurological: Positive for headaches. Negative for dizziness and light-headedness.     Physical Exam Triage Vital Signs ED Triage Vitals [10/26/18 1606]  Enc Vitals Group     BP 131/79     Pulse Rate 93     Resp 18     Temp 98.7 F (37.1 C)     Temp Source Oral     SpO2 97 %     Weight 130 lb (59 kg)     Height 5\' 1"  (1.549 m)     Head Circumference      Peak Flow      Pain Score 0     Pain Loc      Pain Edu?      Excl. in GC?    No data found.  Updated Vital Signs BP 131/79 (BP Location: Right Arm)   Pulse 93   Temp 98.7 F (37.1 C) (Oral)   Resp 18   Ht 5\' 1"  (1.549 m)   Wt 130 lb (59 kg)   LMP 06/18/2018 (Exact Date)   SpO2 97%   BMI 24.56 kg/m   Visual Acuity Right Eye Distance:   Left Eye Distance:   Bilateral Distance:    Right Eye Near:   Left Eye Near:      Bilateral Near:     Physical Exam  Constitutional: She is oriented to person, place, and time. She appears well-developed and well-nourished.  Non-toxic appearance. She does not appear ill. No distress.  HENT:  Head: Normocephalic and atraumatic.  Right Ear: Tympanic membrane normal.  Left Ear: Tympanic membrane normal.  Nose: Nose normal. Right sinus exhibits no maxillary sinus tenderness and no frontal sinus tenderness. Left sinus exhibits no maxillary sinus tenderness and no frontal sinus tenderness.  Mouth/Throat: Uvula is midline and mucous membranes are normal. Posterior oropharyngeal erythema present. No oropharyngeal exudate, posterior oropharyngeal edema or tonsillar abscesses.  Eyes: EOM are normal.  Neck: Normal range of motion. Neck supple.  Cardiovascular: Normal rate and regular rhythm.  Pulmonary/Chest: Effort normal and breath sounds normal. No stridor. No respiratory distress. She has no wheezes. She has no rhonchi. She has no rales.  Musculoskeletal: Normal range of motion.  Lymphadenopathy:    She has no cervical adenopathy.  Neurological: She is alert and oriented to person, place, and time.  Skin: Skin is warm and dry.  Psychiatric: She has a normal mood and affect. Her behavior is normal.  Nursing note and vitals reviewed.    UC Treatments / Results  Labs (all labs ordered are listed, but only abnormal results are displayed) Labs Reviewed  STREP A DNA PROBE  POCT RAPID STREP A (OFFICE)    EKG None  Radiology No results found.  Procedures Procedures (including critical care time)  Medications Ordered in UC Medications - No data to display  Initial Impression / Assessment and Plan / UC Course  I have reviewed the triage vital signs and the nursing notes.  Pertinent labs & imaging results that were available during my care of the patient were reviewed by me and considered in my medical decision making (see chart for details).     Rapid strep:  NEGATIVE Culture sent Home care instructions provided.  Final Clinical Impressions(s) / UC Diagnoses   Final diagnoses:  Acute pharyngitis, unspecified etiology     Discharge Instructions      You may try saltwater gargles and Tylenol 500mg  every 4-6 hours as needed for  pain. Try to stay well hydrated and get plenty of sleep. Please follow up with family medicine in 1 week if not improving.     ED Prescriptions    None     Controlled Substance Prescriptions [place] Controlled Substance Registry consulted? Not Applicable   Rolla Plate 10/26/18 1643

## 2018-10-26 NOTE — ED Triage Notes (Signed)
Pt c/o sore throat, bilateral ear pain, body aches, and HA today. She is [redacted] wks pregnant.

## 2018-10-26 NOTE — Discharge Instructions (Signed)
°  You may try saltwater gargles and Tylenol 500mg  every 4-6 hours as needed for pain. Try to stay well hydrated and get plenty of sleep. Please follow up with family medicine in 1 week if not improving.

## 2018-10-27 ENCOUNTER — Telehealth: Payer: Self-pay | Admitting: Emergency Medicine

## 2018-10-27 LAB — STREP A DNA PROBE: Group A Strep Probe: NOT DETECTED

## 2018-11-02 ENCOUNTER — Ambulatory Visit (HOSPITAL_COMMUNITY)
Admission: RE | Admit: 2018-11-02 | Discharge: 2018-11-02 | Disposition: A | Discharge status: Home / Self Care | Payer: BLUE CROSS/BLUE SHIELD | Source: Ambulatory Visit | Attending: Advanced Practice Midwife | Admitting: Advanced Practice Midwife

## 2018-11-02 DIAGNOSIS — Z363 Encounter for antenatal screening for malformations: Secondary | ICD-10-CM | POA: Diagnosis not present

## 2018-11-02 DIAGNOSIS — Z3402 Encounter for supervision of normal first pregnancy, second trimester: Secondary | ICD-10-CM | POA: Diagnosis not present

## 2018-11-02 DIAGNOSIS — Z3400 Encounter for supervision of normal first pregnancy, unspecified trimester: Secondary | ICD-10-CM

## 2018-11-02 DIAGNOSIS — Z3A19 19 weeks gestation of pregnancy: Secondary | ICD-10-CM | POA: Diagnosis not present

## 2018-11-02 IMAGING — US US MFM OB COMP +14 WKS
1 series · 13 of 28 positions shown · non-contrast
Comparison: none

[Series 1: us mfm ob comp +14 wks · 81 acquisitions, 13 frames shown]
[im 3/81]
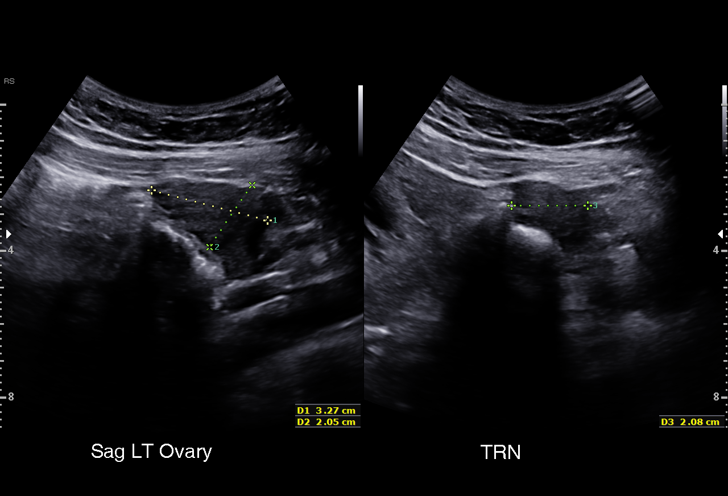
[im 9/81]
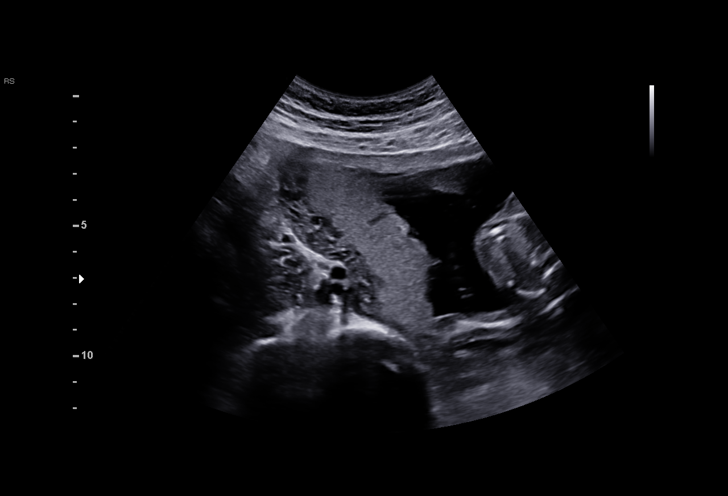
[im 15/81]
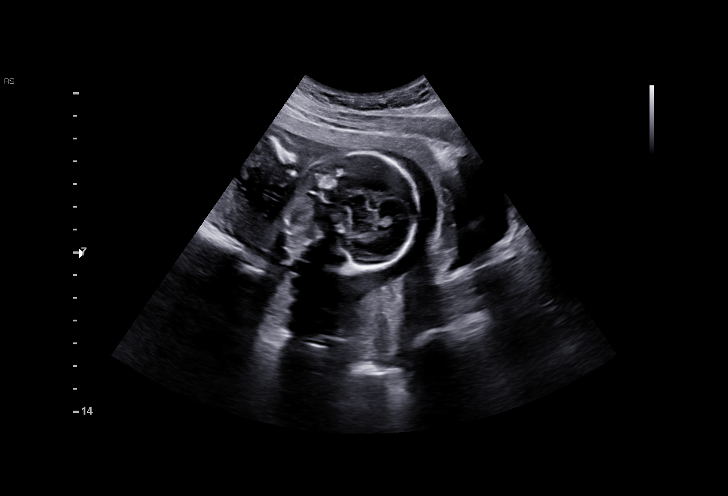
[im 21/81]
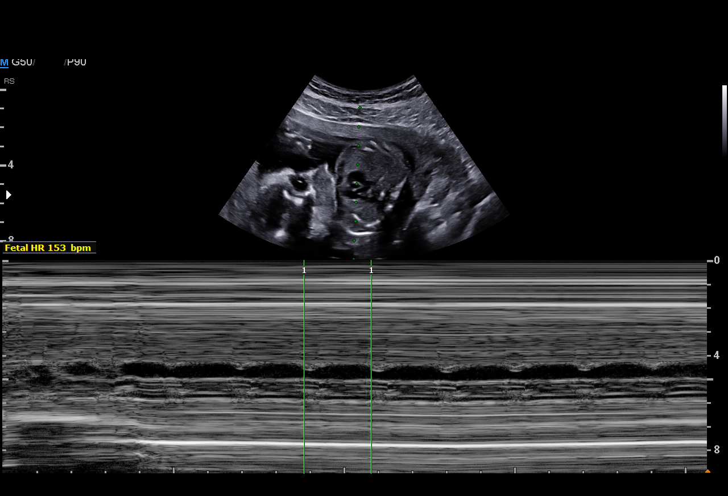
[im 27/81]
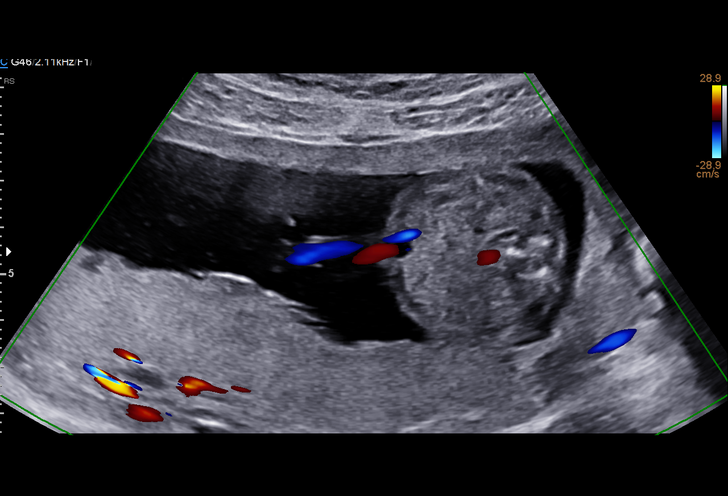
[im 33/81]
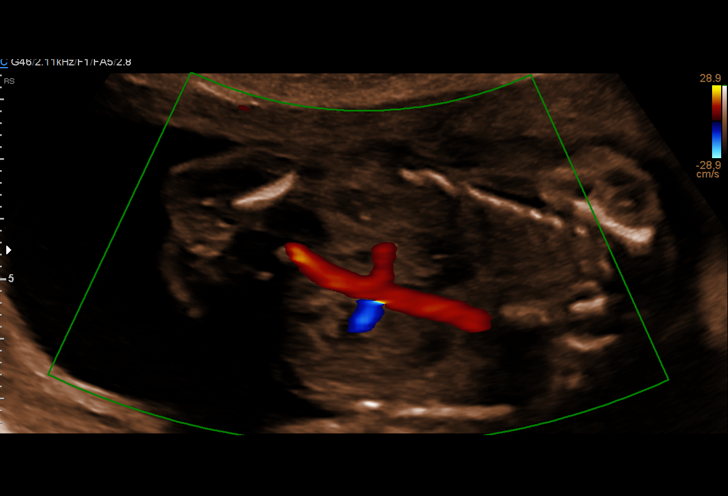
[im 42/81]
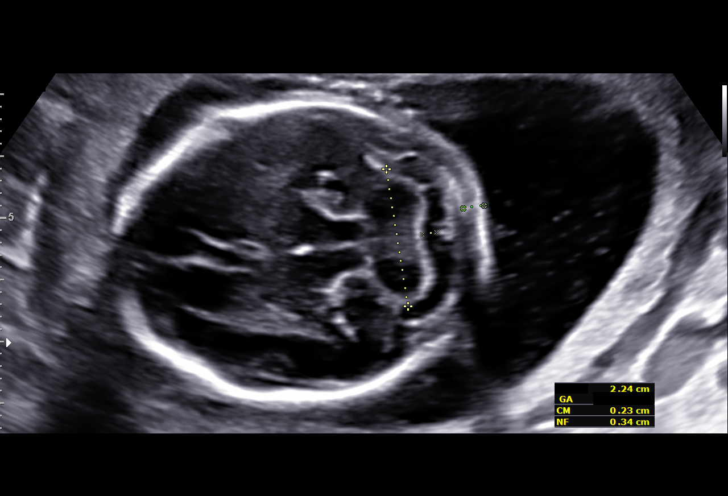
[im 48/81]
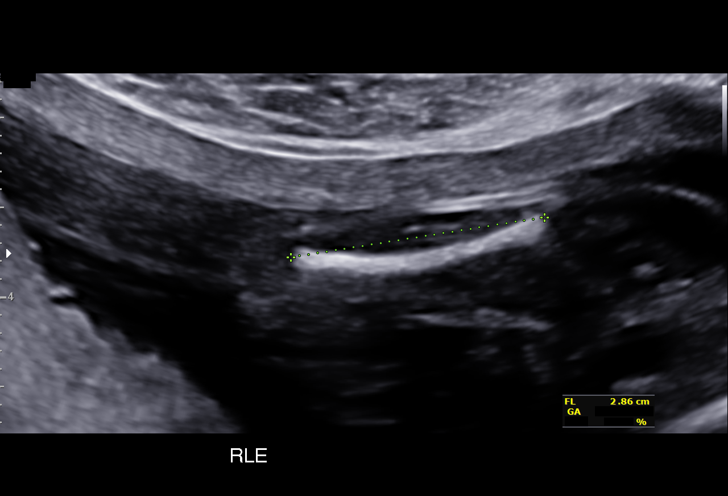
[im 54/81]
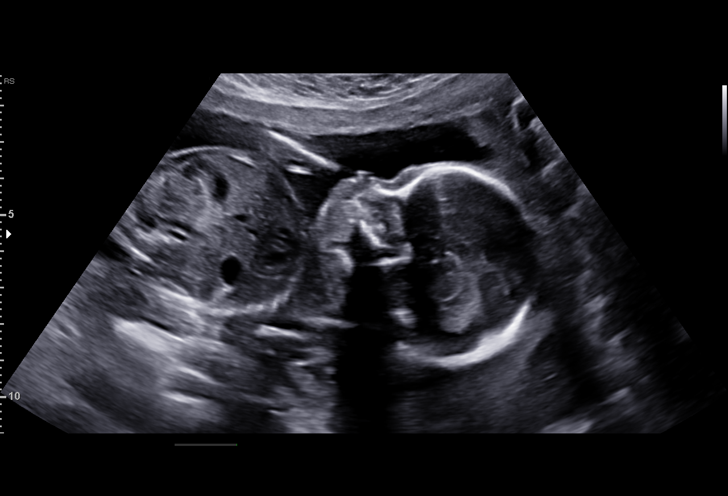
[im 60/81]
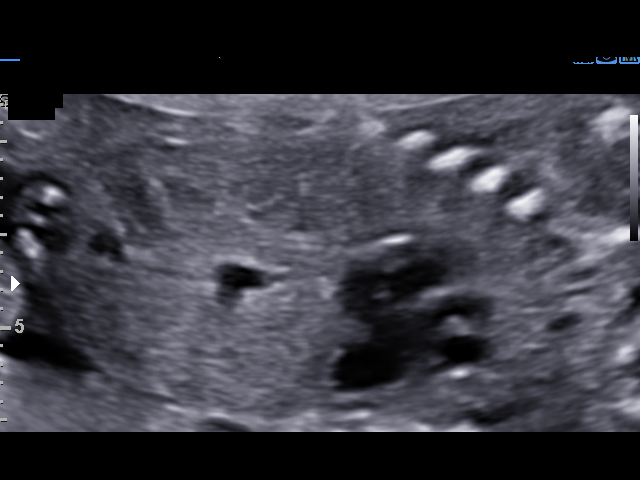
[im 66/81]
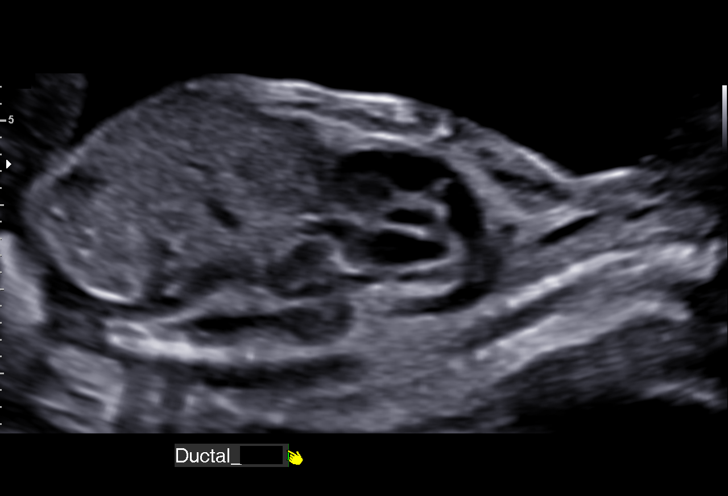
[im 72/81]
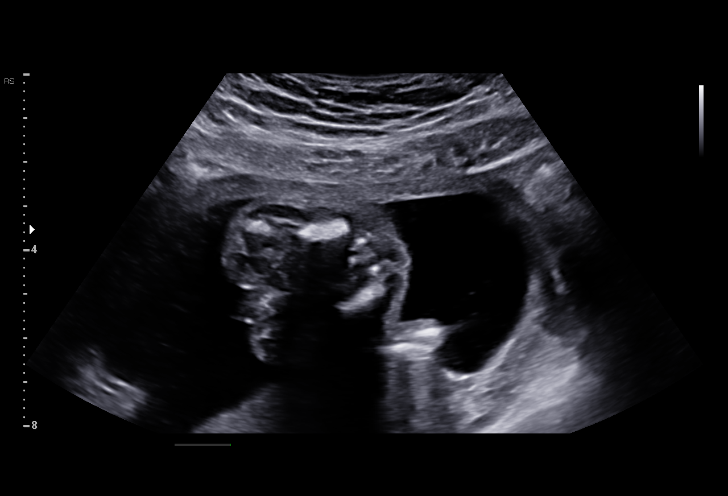
[im 78/81]
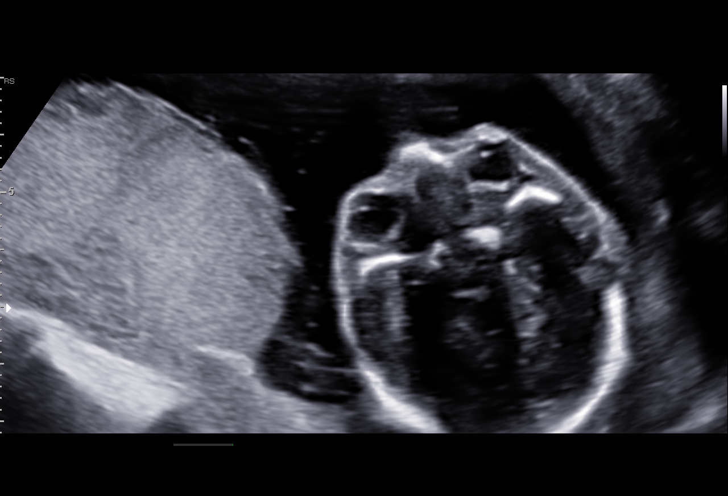

[13 of 28 positions shown; findings below may reference images not displayed]

for [REDACTED]care

 ----------------------------------------------------------------------

 ----------------------------------------------------------------------
Indications

  Encounter for antenatal screening for          [0T]
  malformations
  19 weeks gestation of pregnancy
 ----------------------------------------------------------------------
Fetal Evaluation

 Num Of Fetuses:         1
 Fetal Heart Rate(bpm):  153
 Cardiac Activity:       Observed
 Presentation:           Cephalic
 Placenta:               Posterior
 P. Cord Insertion:      Visualized, central

 Amniotic Fluid
 AFI FV:      Within normal limits

                             Largest Pocket(cm)

Biometry

 BPD:      46.8  mm     G. Age:  20w 1d         74  %    CI:         77.9   %    70 - 86
                                                         FL/HC:      17.2   %    16.8 -
 HC:      167.8  mm     G. Age:  19w 3d         37  %    HC/AC:      1.14        1.09 -
 AC:      147.7  mm     G. Age:  20w 0d         61  %    FL/BPD:     61.5   %
 FL:       28.8  mm     G. Age:  18w 6d         20  %    FL/AC:      19.5   %    20 - 24
 HUM:      29.5  mm     G. Age:  19w 5d         54  %
 CER:      22.4  mm     G. Age:  21w 1d         82  %
 NFT:       3.4  mm
 CM:        2.3  mm

 Est. FW:     297  gm    0 lb 10 oz      46  %
OB History

 Gravidity:    1
Gestational Age

 LMP:           19w 4d        Date:  [DATE]                 EDD:   [DATE]
 U/S Today:     19w 4d                                        EDD:   [DATE]
 Best:          19w 4d     Det. By:  LMP  ([DATE])          EDD:   [DATE]
Anatomy

 Cranium:               Appears normal         Aortic Arch:            Appears normal
 Cavum:                 Not well visualized    Ductal Arch:            Not well visualized
 Ventricles:            Not well visualized    Diaphragm:              Appears normal
 Choroid Plexus:        Not well visualized    Stomach:                Appears normal, left
                                                                       sided
 Cerebellum:            Appears normal         Abdomen:                Appears normal
 Posterior Fossa:       Appears normal         Abdominal Wall:         Appears nml (cord
                                                                       insert, abd wall)
 Nuchal Fold:           Appears normal         Cord Vessels:           Appears normal (3
                                                                       vessel cord)
 Face:                  Orbits nl; profile not Kidneys:                Appear normal
                        well visualized
 Lips:                  Not well visualized    Bladder:                Appears normal
 Thoracic:              Appears normal         Spine:                  Appears normal
 Heart:                 Not well visualized    Upper Extremities:      Appears normal
 RVOT:                  Appears normal         Lower Extremities:      Appears normal
 LVOT:                  Appears normal

 Other:  Heels and 5th digit visualized. Lenses visualized. Technically difficult
         due to fetal position. Male gender.
Cervix Uterus Adnexa

 Cervix
 Length:           4.22  cm.
 Normal appearance by transabdominal scan.

 Uterus
 No abnormality visualized.

 Left Ovary
 Within normal limits.

 Right Ovary
 Within normal limits.

 Adnexa
 No abnormality visualized. No adnexal mass
 visualized.
Impression

 We performed fetal anatomy scan. No makers of
 aneuploidies or fetal structural defects are seen. Fetal
 biometry is consistent with her previously-established dates.
 Amniotic fluid is normal and good fetal activity is seen.

 On cell-free fetal DNA screening, the risks of fetal
 aneuploidies are not increased.
Recommendations

 An appointment was made for her to return in 4 weeks for
 completion of fetal anatomy including CSP.
                 MERITA

## 2018-11-05 ENCOUNTER — Other Ambulatory Visit (HOSPITAL_COMMUNITY): Payer: Self-pay | Admitting: *Deleted

## 2018-11-05 DIAGNOSIS — Z362 Encounter for other antenatal screening follow-up: Secondary | ICD-10-CM

## 2018-11-06 ENCOUNTER — Encounter: Payer: Self-pay | Admitting: Certified Nurse Midwife

## 2018-11-06 ENCOUNTER — Ambulatory Visit (INDEPENDENT_AMBULATORY_CARE_PROVIDER_SITE_OTHER): Payer: BLUE CROSS/BLUE SHIELD | Admitting: Certified Nurse Midwife

## 2018-11-06 VITALS — BP 101/65 | HR 86 | Wt 131.0 lb

## 2018-11-06 DIAGNOSIS — Z3402 Encounter for supervision of normal first pregnancy, second trimester: Secondary | ICD-10-CM

## 2018-11-06 DIAGNOSIS — O99891 Other specified diseases and conditions complicating pregnancy: Secondary | ICD-10-CM

## 2018-11-06 DIAGNOSIS — Z3400 Encounter for supervision of normal first pregnancy, unspecified trimester: Secondary | ICD-10-CM | POA: Diagnosis not present

## 2018-11-06 DIAGNOSIS — O09899 Supervision of other high risk pregnancies, unspecified trimester: Secondary | ICD-10-CM

## 2018-11-06 DIAGNOSIS — O9989 Other specified diseases and conditions complicating pregnancy, childbirth and the puerperium: Secondary | ICD-10-CM

## 2018-11-06 DIAGNOSIS — Z283 Underimmunization status: Secondary | ICD-10-CM

## 2018-11-06 NOTE — Progress Notes (Signed)
   PRENATAL VISIT NOTE  Subjective:  Olivia Gardner is a 27 y.o. G1P0 at 5538w1d being seen today for ongoing prenatal care.  She is currently monitored for the following issues for this low-risk pregnancy and has Tendonitis; Elevated LDL cholesterol level; Shortness of breath; Heart burn; Tinnitus; Epicondylitis elbow, medial; Spotting; No energy; Hyperlipidemia; Ganglion of right wrist; Vitamin D deficiency; Low iron stores; Supervision of low-risk first pregnancy; and Rubella non-immune status, antepartum on their problem list.  Patient reports no complaints.  Contractions: Not present. Vag. Bleeding: None.  Movement: Present. Denies leaking of fluid.   The following portions of the patient's history were reviewed and updated as appropriate: allergies, current medications, past family history, past medical history, past social history, past surgical history and problem list. Problem list updated.  Objective:   Vitals:   11/06/18 1548  BP: 101/65  Pulse: 86  Weight: 131 lb (59.4 kg)    Fetal Status: Fetal Heart Rate (bpm): 146 Fundal Height: 20 cm Movement: Present     General:  Alert, oriented and cooperative. Patient is in no acute distress.  Skin: Skin is warm and dry. No rash noted.   Cardiovascular: Normal heart rate noted  Respiratory: Normal respiratory effort, no problems with respiration noted  Abdomen: Soft, gravid, appropriate for gestational age.  Pain/Pressure: Absent     Pelvic: Cervical exam deferred        Extremities: Normal range of motion.  Edema: None  Mental Status: Normal mood and affect. Normal behavior. Normal judgment and thought content.   Assessment and Plan:  Pregnancy: G1P0 at 4938w1d  1. Encounter for supervision of low-risk first pregnancy, antepartum - Patient doing well, no complaints  - Anticipatory guidance on upcoming appointments with [facility] optimized scheduling, reviewed GTT at next visit and to come to appointment fasting  - Alpha  fetoprotein, maternal  2. Rubella non-immune status, antepartum - Needs PP   Preterm labor symptoms and general obstetric precautions including but not limited to vaginal bleeding, contractions, leaking of fluid and fetal movement were reviewed in detail with the patient. Please refer to After Visit Summary for other counseling recommendations.  Return in about 8 weeks (around 01/01/2019) for ROB/2hrGTT.  Future Appointments  Date Time Provider Department Center  11/29/2018  3:45 PM [facility] US 2 [facility]  12/27/2018  8:30 AM [facility] NURSE [facility]  01/01/2019  4:00 PM Sharyon Cableogers, Malena Timpone C, CNM [facility]    Sharyon CableVeronica C Alicia Ackert, CNM

## 2018-11-06 NOTE — Patient Instructions (Signed)

## 2018-11-07 LAB — TIQ-MISC

## 2018-11-08 LAB — ALPHA FETOPROTEIN, MATERNAL
AFP MoM: 1.21
AFP, Serum: 76.3 ng/mL
Calc'd Gestational Age: 20.1 weeks
Maternal Wt: 130 [lb_av]
Risk for ONTD: 1
Twins-AFP: 1

## 2018-11-29 ENCOUNTER — Encounter (HOSPITAL_COMMUNITY): Payer: Self-pay

## 2018-11-29 ENCOUNTER — Ambulatory Visit (HOSPITAL_COMMUNITY)
Admission: RE | Admit: 2018-11-29 | Discharge: 2018-11-29 | Disposition: A | Discharge status: Home / Self Care | Payer: BLUE CROSS/BLUE SHIELD | Source: Ambulatory Visit | Attending: Obstetrics and Gynecology | Admitting: Obstetrics and Gynecology

## 2018-11-29 DIAGNOSIS — Z3A23 23 weeks gestation of pregnancy: Secondary | ICD-10-CM | POA: Insufficient documentation

## 2018-11-29 DIAGNOSIS — Z362 Encounter for other antenatal screening follow-up: Secondary | ICD-10-CM | POA: Diagnosis not present

## 2018-11-30 ENCOUNTER — Ambulatory Visit (HOSPITAL_COMMUNITY): Payer: BLUE CROSS/BLUE SHIELD

## 2018-12-09 ENCOUNTER — Encounter (HOSPITAL_COMMUNITY): Payer: Self-pay | Admitting: Emergency Medicine

## 2018-12-09 ENCOUNTER — Emergency Department (HOSPITAL_COMMUNITY)
Admission: EM | Admit: 2018-12-09 | Discharge: 2018-12-09 | Disposition: A | Discharge status: Home / Self Care | Payer: BLUE CROSS/BLUE SHIELD | Attending: Emergency Medicine | Admitting: Emergency Medicine

## 2018-12-09 ENCOUNTER — Other Ambulatory Visit: Payer: Self-pay

## 2018-12-09 DIAGNOSIS — R112 Nausea with vomiting, unspecified: Secondary | ICD-10-CM | POA: Diagnosis not present

## 2018-12-09 DIAGNOSIS — R197 Diarrhea, unspecified: Secondary | ICD-10-CM | POA: Insufficient documentation

## 2018-12-09 DIAGNOSIS — R109 Unspecified abdominal pain: Secondary | ICD-10-CM | POA: Diagnosis not present

## 2018-12-09 DIAGNOSIS — Z79899 Other long term (current) drug therapy: Secondary | ICD-10-CM | POA: Diagnosis not present

## 2018-12-09 LAB — COMPREHENSIVE METABOLIC PANEL
ALT: 12 U/L (ref 0–44)
ANION GAP: 12 (ref 5–15)
AST: 18 U/L (ref 15–41)
Albumin: 3.3 g/dL — ABNORMAL LOW (ref 3.5–5.0)
Alkaline Phosphatase: 55 U/L (ref 38–126)
BUN: 6 mg/dL (ref 6–20)
CALCIUM: 9.1 mg/dL (ref 8.9–10.3)
CHLORIDE: 105 mmol/L (ref 98–111)
CO2: 21 mmol/L — AB (ref 22–32)
Creatinine, Ser: 0.59 mg/dL (ref 0.44–1.00)
GFR calc non Af Amer: >60 mL/min (ref >60)
Glucose, Bld: 86 mg/dL (ref 70–99)
Potassium: 4.1 mmol/L (ref 3.5–5.1)
SODIUM: 138 mmol/L (ref 135–145)
Total Bilirubin: 1 mg/dL (ref 0.3–1.2)
Total Protein: 7.1 g/dL (ref 6.5–8.1)

## 2018-12-09 LAB — URINALYSIS, ROUTINE W REFLEX MICROSCOPIC
BILIRUBIN URINE: NEGATIVE
GLUCOSE, UA: NEGATIVE mg/dL
HGB URINE DIPSTICK: NEGATIVE
Ketones, ur: 80 mg/dL — AB
NITRITE: NEGATIVE
PH: 5 (ref 5.0–8.0)
Protein, ur: NEGATIVE mg/dL
SPECIFIC GRAVITY, URINE: 1.024 (ref 1.005–1.030)

## 2018-12-09 LAB — CBC
HCT: 41.3 % (ref 36.0–46.0)
Hemoglobin: 12.9 g/dL (ref 12.0–15.0)
MCH: 29.8 pg (ref 26.0–34.0)
MCHC: 31.2 g/dL (ref 30.0–36.0)
MCV: 95.4 fL (ref 80.0–100.0)
NRBC: 0 % (ref 0.0–0.2)
PLATELETS: 214 10*3/uL (ref 150–400)
RBC: 4.33 MIL/uL (ref 3.87–5.11)
RDW: 13.2 % (ref 11.5–15.5)
WBC: 17.3 10*3/uL — ABNORMAL HIGH (ref 4.0–10.5)

## 2018-12-09 LAB — LIPASE, BLOOD: Lipase: 19 U/L (ref 11–51)

## 2018-12-09 MED ORDER — METOCLOPRAMIDE HCL 5 MG/ML IJ SOLN
5.0000 mg | Freq: Once | INTRAMUSCULAR | Status: AC
  Administered 2018-12-09: 5 mg via INTRAVENOUS
  Filled 2018-12-09: qty 2

## 2018-12-09 MED ORDER — SODIUM CHLORIDE 0.9 % IV BOLUS
1000.0000 mL | Freq: Once | INTRAVENOUS | Status: AC
  Administered 2018-12-09: 1000 mL via INTRAVENOUS

## 2018-12-09 NOTE — ED Provider Notes (Signed)
[facility] Provider Note   CSN: [id number] Arrival date & time: 12/09/18  2007     History   Chief Complaint Chief Complaint  Patient presents with  . Abdominal Cramping    HPI Olivia Gardner is a 27 y.o. female.  27 yo F gravida 1 para 0 with a chief complaint of nausea vomiting and diarrhea.  [place] has been having diarrhea since then and then started having vomiting thereafter.  She has not had any in the past 6 to 12 hours or so.  Still having some lower abdominal cramping.  Cramping has been getting somewhat better.  She denies urinary symptoms denies dark stool or blood in her stool denies fevers or chills.  No one else around her has been sick with a similar illness.  She called her OB/GYN who told her she needed to come to the ED for evaluation.  She denies vaginal bleeding or discharge.  Denies complications thus far with pregnancy.  The history is provided by the spouse and the patient.  Abdominal Cramping  Associated symptoms include abdominal pain (cramping). Pertinent negatives include no chest pain, no headaches and no shortness of breath.  Illness  This is a new problem. The current episode started 2 days ago. The problem occurs constantly. The problem has been gradually improving. Associated symptoms include abdominal pain (cramping). Pertinent negatives include no chest pain, no headaches and no shortness of breath. Nothing aggravates the symptoms. Nothing relieves the symptoms. She has tried nothing for the symptoms. The treatment provided no relief.    Past Medical History:  Diagnosis Date  . Hyperlipidemia     Patient Active Problem List   Diagnosis Date Noted  . Rubella non-immune status, antepartum 09/01/2018  . Supervision of low-risk first pregnancy 08/28/2018  . Vitamin D deficiency 03/28/2016  . Low iron stores 03/28/2016  . No energy 03/25/2016  . Hyperlipidemia 03/25/2016    . Ganglion of right wrist 03/25/2016  . Spotting 03/30/2015  . Epicondylitis elbow, medial 02/02/2015  . Tendonitis 01/30/2015  . Elevated LDL cholesterol level 01/30/2015  . Shortness of breath 01/30/2015  . Heart burn 01/30/2015  . Tinnitus 01/30/2015    Past Surgical History:  Procedure Laterality Date  . WISDOM TOOTH EXTRACTION       OB History    Gravida  1   Para      Term      Preterm      AB      Living  0     SAB      TAB      Ectopic      Multiple      Live Births               Home Medications    Prior to Admission medications   Medication Sig Start Date End Date Taking? Authorizing Provider  calcium carbonate (TUMS - DOSED IN MG ELEMENTAL CALCIUM) 500 MG chewable tablet Chew 1 tablet by mouth daily.   Yes [provider]  loratadine (CLARITIN) 10 MG tablet Take 10 mg by mouth daily.   Yes [provider]  Prenatal Vit-Fe Fumarate-FA (PRENATAL VITAMIN PO) Take 1 tablet by mouth daily.    Yes [provider]  VITAMIN D PO Take 1 tablet by mouth daily.   Yes [provider]  ferrous sulfate 325 (65 FE) MG EC tablet Take 1 tablet (325 mg total) by mouth daily  with breakfast. Patient not taking: Reported on 11/06/2018 03/28/16   Jomarie Longs, PA-C  Vitamin D, Ergocalciferol, (DRISDOL) 50000 units CAPS capsule Take 1 capsule (50,000 Units total) by mouth every 7 (seven) days. Patient not taking: Reported on 12/09/2018 03/28/16   Jomarie Longs, PA-C    Family History Family History  Problem Relation Age of Onset  . Hyperlipidemia Mother   . Hypertension Father   . Diabetes Maternal Grandmother   . Diabetes Paternal Grandmother   . Asthma Neg Hx   . Stroke Neg Hx     Social History Social History   Tobacco Use  . Smoking status: Never Smoker  . Smokeless tobacco: Never Used  Substance Use Topics  . Alcohol use: Not Currently    Alcohol/week: 0.0 standard drinks  . Drug use: No      Allergies   Sulfacetamide sodium   Review of Systems Review of Systems  Constitutional: Negative for chills and fever.  HENT: Negative for congestion and rhinorrhea.   Eyes: Negative for redness and visual disturbance.  Respiratory: Negative for shortness of breath and wheezing.   Cardiovascular: Negative for chest pain and palpitations.  Gastrointestinal: Positive for abdominal pain (cramping). Negative for nausea and vomiting.  Genitourinary: Negative for dysuria and urgency.  Musculoskeletal: Negative for arthralgias and myalgias.  Skin: Negative for pallor and wound.  Neurological: Negative for dizziness and headaches.     Physical Exam Updated Vital Signs BP 117/81   Pulse 92   Temp 98.6 F (37 C) (Oral)   Resp 16   LMP 06/18/2018 (Exact Date)   SpO2 100%   Physical Exam Vitals signs and nursing note reviewed.  Constitutional:      General: She is not in acute distress.    Appearance: She is well-developed. She is not diaphoretic.  HENT:     Head: Normocephalic and atraumatic.  Eyes:     Pupils: Pupils are equal, round, and reactive to light.  Neck:     Musculoskeletal: Normal range of motion and neck supple.  Cardiovascular:     Rate and Rhythm: Normal rate and regular rhythm.     Heart sounds: No murmur. No friction rub. No gallop.   Pulmonary:     Effort: Pulmonary effort is normal.     Breath sounds: No wheezing or rales.  Abdominal:     General: There is no distension.     Palpations: Abdomen is soft.     Tenderness: There is no abdominal tenderness.     Comments: Gravid, mild lower diffuse tenderness  Musculoskeletal:        General: No tenderness.  Skin:    General: Skin is warm and dry.  Neurological:     Mental Status: She is alert and oriented to person, place, and time.  Psychiatric:        Behavior: Behavior normal.      ED Treatments / Results  Labs (all labs ordered are listed, but only abnormal results are displayed) Labs  Reviewed  COMPREHENSIVE METABOLIC PANEL - Abnormal; Notable for the following components:      Result Value   CO2 21 (*)    Albumin 3.3 (*)    All other components within normal limits  CBC - Abnormal; Notable for the following components:   WBC 17.3 (*)    All other components within normal limits  URINALYSIS, ROUTINE W REFLEX MICROSCOPIC - Abnormal; Notable for the following components:   APPearance CLOUDY (*)    Ketones,  ur 80 (*)    Leukocytes, UA MODERATE (*)    Bacteria, UA RARE (*)    All other components within normal limits  LIPASE, BLOOD    EKG None  Radiology No results found.  Procedures Procedures (including critical care time) EMERGENCY DEPARTMENT [place] PREGNANCY "Study: Limited Ultrasound of the Pelvis"  INDICATIONS:Pregnancy(required) and Abdominal or pelvic pain Multiple views of the uterus and pelvic cavity are obtained with a multi-frequency probe.  APPROACH:Transabdominal   PERFORMED BY: Myself  IMAGES ARCHIVED?: Yes  LIMITATIONS: none  PREGNANCY FREE FLUID: Present  PREGNANCY UTERUS FINDINGS:Uterus enlarged and Gestational sac noted ADNEXAL FINDINGS:Left ovary not seen and Right ovary not seen  PREGNANCY FINDINGS: Intrauterine gestational sac noted, Fetal pole present and Fetal heart activity seen  INTERPRETATION: Intrauterine gestational sac noted and Fetal heart activity seen  GESTATIONAL AGE, ESTIMATE: 25wk 1 day HR 140  Medications Ordered in ED Medications  sodium chloride 0.9 % bolus 1,000 mL (1,000 mLs Intravenous New Bag/Given 12/09/18 2219)  metoCLOPramide (REGLAN) injection 5 mg (5 mg Intravenous Given 12/09/18 2219)     Initial Impression / Assessment and Plan / ED Course  I have reviewed the triage vital signs and the nursing notes.  Pertinent labs & imaging results that were available during my care of the patient were reviewed by me and considered in my medical decision making (see chart for details).     27 yo F with a  chief complaint of nausea vomiting diarrhea.  Came to the ED after talking with OB/GYN due to possible decreased fetal movement.  Bedside ultrasound with fetal movement, heart rate reassuring.  Patient's with out vomiting while in the ED.  Given oral trial without issue.  Given IV fluids and discharged home.  11:20 PM:  I have discussed the diagnosis/risks/treatment options with the patient and family and believe the pt to be eligible for discharge home to follow-up with PCP, OBGYN. We also discussed returning to the ED immediately if new or worsening sx occur. We discussed the sx which are most concerning (e.g., sudden worsening pain, fever, inability to tolerate by mouth) that necessitate immediate return. Medications administered to the patient during their visit and any new prescriptions provided to the patient are listed below.  Medications given during this visit Medications  sodium chloride 0.9 % bolus 1,000 mL (1,000 mLs Intravenous New Bag/Given 12/09/18 2219)  metoCLOPramide (REGLAN) injection 5 mg (5 mg Intravenous Given 12/09/18 2219)      The patient appears reasonably screen and/or stabilized for discharge and I doubt any other medical condition or other [facility] requiring further screening, evaluation, or treatment in the ED at this time prior to discharge.    Final Clinical Impressions(s) / ED Diagnoses   Final diagnoses:  Nausea vomiting and diarrhea    ED Discharge Orders    None       Melene Plan, DO 12/09/18 2320

## 2018-12-09 NOTE — ED Notes (Signed)
Patient verbalizes understanding of discharge instructions. Opportunity for questioning and answers were provided. 

## 2018-12-09 NOTE — Discharge Instructions (Signed)
Go to a drugstore and buy unisom and vitaminb6(pyridoxine) Take 1/2 a tab of unisom(12.5mg ) and 25mg  of vitamin b6.  Take this at night before bed.  If you continue to have nausea and vomiting take twice a day.    Try the brat diet, bananas rice applesauce and toast.

## 2018-12-09 NOTE — ED Triage Notes (Signed)
Pt c/o nausea/vomiting/diarrhea since last night. Pt reports abdominal cramping, [redacted] weeks pregnant. States she hasn't felt the baby move as often since being sick. G1P0, denies loss of fluids or vaginal bleeding.

## 2018-12-26 NOTE — L&D Delivery Note (Signed)
Delivery Note At bedside to assess pushing effectiveness after first hour of second stage. Particulate meconium noted. Axillary temp 100.8. Patient s/p diagnosis and antibiotic prophylaxis for Triple I.  NICU requested for delivery. At 1539 a viable female was delivered via (Presentation:LOT ;LOA ).  No nuchal detected. Shoulder and body delivered in usual fashion. Infant without spontaneous cry or movement immediately after delivery. No notable improvement after nose and mouth suctioned by RN. Cord clamped and cut at approximately 45 seconds by FOB due to poor infant status. Infant transferred to warmer for NICU staff assessment. Fundus firm with massage and Pitocin. Labia, perineum, vagina, and cervix inspected inspected with second degree laceration. Placenta status:intact , removed with gentle cord traction and minimal maternal effort .  Cord: three vessels with the following complications: none.    APGAR:1, 6,8  weight: 3371g    Anesthesia:  Epidural and lidocaine for perineal repair Episiotomy:  none Lacerations:  2nd degree perineal Suture Repair: 3.0 Monocryl Est. Blood Loss (mL): 321   Mom to postpartum.  Baby to Couplet care / Skin to Skin. Placenta to pathology  Calvert Cantor, CNM 04/01/2019, 5:30 PM

## 2018-12-27 ENCOUNTER — Other Ambulatory Visit: Payer: BLUE CROSS/BLUE SHIELD

## 2018-12-27 DIAGNOSIS — Z3480 Encounter for supervision of other normal pregnancy, unspecified trimester: Secondary | ICD-10-CM | POA: Diagnosis not present

## 2018-12-27 NOTE — Progress Notes (Signed)
Pt here for 28 week labs only 

## 2018-12-28 LAB — CBC
HCT: 33.3 % — ABNORMAL LOW (ref 35.0–45.0)
Hemoglobin: 11.4 g/dL — ABNORMAL LOW (ref 11.7–15.5)
MCH: 30.8 pg (ref 27.0–33.0)
MCHC: 34.2 g/dL (ref 32.0–36.0)
MCV: 90 fL (ref 80.0–100.0)
MPV: 11.2 fL (ref 7.5–12.5)
Platelets: 196 10*3/uL (ref 140–400)
RBC: 3.7 10*6/uL — AB (ref 3.80–5.10)
RDW: 12.9 % (ref 11.0–15.0)
WBC: 9 10*3/uL (ref 3.8–10.8)

## 2018-12-28 LAB — 2HR GTT W 1 HR, CARPENTER, 75 G
Glucose, 1 Hr, Gest: 114 mg/dL (ref 65–179)
Glucose, 2 Hr, Gest: 102 mg/dL (ref 65–152)
Glucose, Fasting, Gest: 66 mg/dL (ref 65–91)

## 2018-12-28 LAB — RPR: RPR Ser Ql: NONREACTIVE

## 2018-12-28 LAB — HIV ANTIBODY (ROUTINE TESTING W REFLEX): HIV 1&2 Ab, 4th Generation: NONREACTIVE

## 2019-01-01 ENCOUNTER — Encounter: Payer: Self-pay | Admitting: Certified Nurse Midwife

## 2019-01-01 ENCOUNTER — Ambulatory Visit (INDEPENDENT_AMBULATORY_CARE_PROVIDER_SITE_OTHER): Payer: BLUE CROSS/BLUE SHIELD | Admitting: Certified Nurse Midwife

## 2019-01-01 VITALS — BP 103/62 | HR 81 | Wt 142.0 lb

## 2019-01-01 DIAGNOSIS — Z23 Encounter for immunization: Secondary | ICD-10-CM

## 2019-01-01 DIAGNOSIS — O99891 Other specified diseases and conditions complicating pregnancy: Secondary | ICD-10-CM

## 2019-01-01 DIAGNOSIS — Z3403 Encounter for supervision of normal first pregnancy, third trimester: Secondary | ICD-10-CM

## 2019-01-01 DIAGNOSIS — Z2839 Other underimmunization status: Secondary | ICD-10-CM

## 2019-01-01 DIAGNOSIS — Z3400 Encounter for supervision of normal first pregnancy, unspecified trimester: Secondary | ICD-10-CM

## 2019-01-01 DIAGNOSIS — O9989 Other specified diseases and conditions complicating pregnancy, childbirth and the puerperium: Secondary | ICD-10-CM

## 2019-01-01 DIAGNOSIS — Z283 Underimmunization status: Secondary | ICD-10-CM

## 2019-01-01 NOTE — Progress Notes (Signed)
Upper back pain and leg cramps

## 2019-01-01 NOTE — Patient Instructions (Signed)

## 2019-01-02 NOTE — Progress Notes (Signed)
   PRENATAL VISIT NOTE  Subjective:  Olivia Gardner is a 28 y.o. G1P0 at [redacted]w[redacted]d being seen today for ongoing prenatal care.  She is currently monitored for the following issues for this low-risk pregnancy and has Tendonitis; Elevated LDL cholesterol level; Shortness of breath; Heart burn; Tinnitus; Epicondylitis elbow, medial; Spotting; No energy; Hyperlipidemia; Ganglion of right wrist; Vitamin D deficiency; Low iron stores; Supervision of low-risk first pregnancy; and Rubella non-immune status, antepartum on their problem list.  Patient reports backache and leg cramps.  Contractions: Not present. Vag. Bleeding: None.  Movement: Present. Denies leaking of fluid.   The following portions of the patient's history were reviewed and updated as appropriate: allergies, current medications, past family history, past medical history, past social history, past surgical history and problem list. Problem list updated.  Objective:   Vitals:   01/01/19 1541  BP: 103/62  Pulse: 81  Weight: 142 lb (64.4 kg)    Fetal Status: Fetal Heart Rate (bpm): 138 Fundal Height: 27 cm Movement: Present     General:  Alert, oriented and cooperative. Patient is in no acute distress.  Skin: Skin is warm and dry. No rash noted.   Cardiovascular: Normal heart rate noted  Respiratory: Normal respiratory effort, no problems with respiration noted  Abdomen: Soft, gravid, appropriate for gestational age.  Pain/Pressure: Absent     Pelvic: Cervical exam deferred        Extremities: Normal range of motion.  Edema: Trace  Mental Status: Normal mood and affect. Normal behavior. Normal judgment and thought content.   Assessment and Plan:  Pregnancy: G1P0 at [redacted]w[redacted]d  1. Encounter for supervision of low-risk first pregnancy, antepartum - Patient doing well, reports occasional back pain, leg cramps that occur at night, and difficulty sleeping . She reports taking Tylenol for back pain and leg cramps with some relief but unable  to sleep well.  - Discussed use of magnesium supplementation 250mg  - Educated on safe medication during pregnancy  - Routine prenatal care and anticipatory guidance   2. Rubella non-immune status, antepartum - needs postpartum   Preterm labor symptoms and general obstetric precautions including but not limited to vaginal bleeding, contractions, leaking of fluid and fetal movement were reviewed in detail with the patient. Please refer to After Visit Summary for other counseling recommendations.  Return in about 4 weeks (around 01/29/2019) for ROB.  Future Appointments  Date Time Provider Department Center  01/29/2019  3:45 PM Sharyon Cable, CNM [facility]    Sharyon Cable, CNM

## 2019-01-29 ENCOUNTER — Ambulatory Visit (INDEPENDENT_AMBULATORY_CARE_PROVIDER_SITE_OTHER): Payer: BLUE CROSS/BLUE SHIELD | Admitting: Certified Nurse Midwife

## 2019-01-29 ENCOUNTER — Encounter: Payer: Self-pay | Admitting: Certified Nurse Midwife

## 2019-01-29 VITALS — BP 112/69 | HR 82 | Wt 145.0 lb

## 2019-01-29 DIAGNOSIS — Z2839 Other underimmunization status: Secondary | ICD-10-CM

## 2019-01-29 DIAGNOSIS — Z283 Underimmunization status: Secondary | ICD-10-CM

## 2019-01-29 DIAGNOSIS — O9989 Other specified diseases and conditions complicating pregnancy, childbirth and the puerperium: Secondary | ICD-10-CM

## 2019-01-29 DIAGNOSIS — O99891 Other specified diseases and conditions complicating pregnancy: Secondary | ICD-10-CM

## 2019-01-29 DIAGNOSIS — Z3403 Encounter for supervision of normal first pregnancy, third trimester: Secondary | ICD-10-CM

## 2019-01-29 NOTE — Progress Notes (Signed)
   PRENATAL VISIT NOTE  Subjective:  Olivia Gardner is a 28 y.o. G1P0 at 85w1dbeing seen today for ongoing prenatal care.  She is currently monitored for the following issues for this low-risk pregnancy and has Tendonitis; Elevated LDL cholesterol level; Shortness of breath; Heart burn; Tinnitus; Epicondylitis elbow, medial; Spotting; No energy; Hyperlipidemia; Ganglion of right wrist; Vitamin D deficiency; Low iron stores; Supervision of low-risk first pregnancy; and Rubella non-immune status, antepartum on their problem list.  Patient reports no complaints.  Contractions: Not present. Vag. Bleeding: None.  Movement: Present. Denies leaking of fluid.   The following portions of the patient's history were reviewed and updated as appropriate: allergies, current medications, past family history, past medical history, past social history, past surgical history and problem list. Problem list updated.  Objective:  Vitals:   01/29/19 1537  BP: 112/69  Pulse: 82  Weight: 145 lb (65.8 kg)    Fetal Status: Fetal Heart Rate (bpm): 134 Fundal Height: 30 cm Movement: Present     General:  Alert, oriented and cooperative. Patient is in no acute distress.  Skin: Skin is warm and dry. No rash noted.   Cardiovascular: Normal heart rate noted  Respiratory: Normal respiratory effort, no problems with respiration noted  Abdomen: Soft, gravid, appropriate for gestational age.  Pain/Pressure: Absent     Pelvic: Cervical exam deferred        Extremities: Normal range of motion.  Edema: Trace  Mental Status: Normal mood and affect. Normal behavior. Normal judgment and thought content.   Assessment and Plan:  Pregnancy: G1P0 at 369w1d1. Encounter for supervision of low-risk first pregnancy in third trimester - Patient doing well, no complaints or concerns, reports leg cramps have improved since use of magnesium  - Discussed results of normal GTT and anticipatory guidance on upcoming appointments with  GBS screening  2. Rubella non-immune status, antepartum - MMR PP   Preterm labor symptoms and general obstetric precautions including but not limited to vaginal bleeding, contractions, leaking of fluid and fetal movement were reviewed in detail with the patient. Please refer to After Visit Summary for other counseling recommendations.  Return in about 4 weeks (around 02/26/2019) for ROB.  Future Appointments  Date Time Provider De[place]3/02/2019  3:45 PM RoLajean ManesCNMarathonCNM

## 2019-02-26 ENCOUNTER — Ambulatory Visit (INDEPENDENT_AMBULATORY_CARE_PROVIDER_SITE_OTHER): Payer: BLUE CROSS/BLUE SHIELD | Admitting: Certified Nurse Midwife

## 2019-02-26 ENCOUNTER — Encounter: Payer: Self-pay | Admitting: Certified Nurse Midwife

## 2019-02-26 VITALS — BP 110/68 | HR 73 | Wt 151.0 lb

## 2019-02-26 DIAGNOSIS — B373 Candidiasis of vulva and vagina: Secondary | ICD-10-CM

## 2019-02-26 DIAGNOSIS — Z3A36 36 weeks gestation of pregnancy: Secondary | ICD-10-CM

## 2019-02-26 DIAGNOSIS — B3731 Acute candidiasis of vulva and vagina: Secondary | ICD-10-CM

## 2019-02-26 DIAGNOSIS — O9989 Other specified diseases and conditions complicating pregnancy, childbirth and the puerperium: Secondary | ICD-10-CM

## 2019-02-26 DIAGNOSIS — Z3483 Encounter for supervision of other normal pregnancy, third trimester: Secondary | ICD-10-CM

## 2019-02-26 DIAGNOSIS — Z3480 Encounter for supervision of other normal pregnancy, unspecified trimester: Secondary | ICD-10-CM | POA: Diagnosis not present

## 2019-02-26 DIAGNOSIS — Z113 Encounter for screening for infections with a predominantly sexual mode of transmission: Secondary | ICD-10-CM | POA: Diagnosis not present

## 2019-02-26 DIAGNOSIS — O09899 Supervision of other high risk pregnancies, unspecified trimester: Secondary | ICD-10-CM

## 2019-02-26 DIAGNOSIS — O99891 Other specified diseases and conditions complicating pregnancy: Secondary | ICD-10-CM

## 2019-02-26 DIAGNOSIS — Z283 Underimmunization status: Secondary | ICD-10-CM

## 2019-02-26 LAB — OB RESULTS CONSOLE GBS: GBS: NEGATIVE

## 2019-02-26 LAB — OB RESULTS CONSOLE GC/CHLAMYDIA: Gonorrhea: NEGATIVE

## 2019-02-26 MED ORDER — TERCONAZOLE 0.8 % VA CREA: 1.0000 | TOPICAL_CREAM | Freq: Every day | VAGINAL | 0 refills | Status: DC

## 2019-02-26 NOTE — Patient Instructions (Signed)
Reasons to go to MAU:  1.  Contractions are  5 minutes apart or less, each last 1 minute, these have been going on for 1-2 hours, and you cannot walk or talk during them 2.  You have a large gush of fluid, or a trickle of fluid that will not stop and you have to wear a pad 3.  You have bleeding that is bright red, heavier than spotting--like menstrual bleeding (spotting can be normal in early labor or after a check of your cervix) 4.  You do not feel the baby moving like he/she normally does  

## 2019-02-26 NOTE — Progress Notes (Signed)
   PRENATAL VISIT NOTE  Subjective:  Olivia Gardner is a 28 y.o. G1P0 at 58w1dbeing seen today for ongoing prenatal care.  She is currently monitored for the following issues for this low-risk pregnancy and has Tendonitis; Elevated LDL cholesterol level; Shortness of breath; Heart burn; Tinnitus; Epicondylitis elbow, medial; Spotting; No energy; Hyperlipidemia; Ganglion of right wrist; Vitamin D deficiency; Low iron stores; Supervision of low-risk first pregnancy; and Rubella non-immune status, antepartum on their problem list.  Patient reports vaginal irritation and vaginal discharge.  Contractions: Not present. Vag. Bleeding: None.  Movement: Absent. Denies leaking of fluid.   The following portions of the patient's history were reviewed and updated as appropriate: allergies, current medications, past family history, past medical history, past social history, past surgical history and problem list. Problem list updated.  Objective:   Vitals:   02/26/19 1554  BP: 110/68  Pulse: 73  Weight: 151 lb (68.5 kg)    Fetal Status:     Movement: Absent     General:  Alert, oriented and cooperative. Patient is in no acute distress.  Skin: Skin is warm and dry. No rash noted.   Cardiovascular: Normal heart rate noted  Respiratory: Normal respiratory effort, no problems with respiration noted  Abdomen: Soft, gravid, appropriate for gestational age.  Pain/Pressure: Present     Pelvic: Cervical exam deferred        Extremities: Normal range of motion.  Edema: Mild pitting, slight indentation  Mental Status: Normal mood and affect. Normal behavior. Normal judgment and thought content.   Assessment and Plan:  Pregnancy: G1P0 at 365w1d1. Supervision of other normal pregnancy, antepartum GBS swab done today. Routine prenatal care.  Anticipatory guidance provided.  F/U in 2 weeks. - Culture, beta strep (group b only) - Cervicovaginal ancillary only( [place])  2. Rubella non-immune status,  antepartum Plan for postpartum MMR.  3. Vulvovaginal candidiasis History and physical exam consistent with vulvovaginal candidiasis.  Cervicovaginal ancillary swab performed today, but will preemptively treat with terconazole. Discussed wearing cotton underwear, avoiding scented soaps, and douching.  If symptoms worsen or persist for more than 3 days, call office and plan to treat with oral medication. - terconazole (TERAZOL 3) 0.8 % vaginal cream; Place 1 applicator vaginally at bedtime.  Dispense: 20 g; Refill: 0  Term labor symptoms and general obstetric precautions including but not limited to vaginal bleeding, contractions, leaking of fluid and fetal movement were reviewed in detail with the patient. Please refer to After Visit Summary for other counseling recommendations.  Return in about 2 weeks (around 03/12/2019) for [place] Future Appointments  Date Time Provider De[place]3/17/2020  9:45 AM RoLajean ManesCNM [facility]  JaCovingtonaRosana HoesSNP

## 2019-02-28 LAB — CERVICOVAGINAL ANCILLARY ONLY
Chlamydia: NEGATIVE
Neisseria Gonorrhea: NEGATIVE

## 2019-03-01 LAB — CULTURE, BETA STREP (GROUP B ONLY)
MICRO NUMBER:: 271050
SPECIMEN QUALITY:: ADEQUATE

## 2019-03-12 ENCOUNTER — Ambulatory Visit (INDEPENDENT_AMBULATORY_CARE_PROVIDER_SITE_OTHER): Payer: BLUE CROSS/BLUE SHIELD | Admitting: Certified Nurse Midwife

## 2019-03-12 ENCOUNTER — Encounter: Payer: Self-pay | Admitting: Certified Nurse Midwife

## 2019-03-12 ENCOUNTER — Other Ambulatory Visit: Payer: Self-pay

## 2019-03-12 VITALS — BP 123/78 | HR 75 | Wt 157.0 lb

## 2019-03-12 DIAGNOSIS — Z3A38 38 weeks gestation of pregnancy: Secondary | ICD-10-CM

## 2019-03-12 DIAGNOSIS — O9989 Other specified diseases and conditions complicating pregnancy, childbirth and the puerperium: Secondary | ICD-10-CM

## 2019-03-12 DIAGNOSIS — Z3480 Encounter for supervision of other normal pregnancy, unspecified trimester: Secondary | ICD-10-CM

## 2019-03-12 DIAGNOSIS — Z283 Underimmunization status: Secondary | ICD-10-CM

## 2019-03-12 DIAGNOSIS — Z2839 Other underimmunization status: Secondary | ICD-10-CM

## 2019-03-12 NOTE — Progress Notes (Signed)
   PRENATAL VISIT NOTE  Subjective:  Olivia Gardner is a 28 y.o. G1P0 at 15w1dbeing seen today for ongoing prenatal care.  She is currently monitored for the following issues for this low-risk pregnancy and has Tendonitis; Elevated LDL cholesterol level; Shortness of breath; Heart burn; Tinnitus; Epicondylitis elbow, medial; Spotting; No energy; Hyperlipidemia; Ganglion of right wrist; Vitamin D deficiency; Low iron stores; Supervision of low-risk first pregnancy; and Rubella non-immune status, antepartum on their problem list.  Patient reports no complaints.  Contractions: Irritability. Vag. Bleeding: None.  Movement: Present. Denies leaking of fluid.   The following portions of the patient's history were reviewed and updated as appropriate: allergies, current medications, past family history, past medical history, past social history, past surgical history and problem list.   Objective:   Vitals:   03/12/19 0944  BP: 123/78  Pulse: 75  Weight: 157 lb (71.2 kg)    Fetal Status: Fetal Heart Rate (bpm): 144   Movement: Present     General:  Alert, oriented and cooperative. Patient is in no acute distress.  Skin: Skin is warm and dry. No rash noted.   Cardiovascular: Normal heart rate noted  Respiratory: Normal respiratory effort, no problems with respiration noted  Abdomen: Soft, gravid, appropriate for gestational age.  Pain/Pressure: Present     Pelvic: Cervical exam performed        Extremities: Normal range of motion.  Edema: Mild pitting, slight indentation  Mental Status: Normal mood and affect. Normal behavior. Normal judgment and thought content.   Assessment and Plan:  Pregnancy: G1P0 at 339w1d. Supervision of other normal pregnancy, antepartum -Desired to be checked today. Vaginal exam performed. -Routine prenatal care. -Anticipatory guidance given, including labor precautions and [facility] -Discussed coronavirus precautions, including hand-washing,  avoiding sick people, and social distancing.  Anticipate changes in the coming weeks to help minimize exposure to virus and discussed with patient.  Also, anticipate limitations in visitors at the hospital during delivery. -F/U in 1 week.  2. Rubella non-immune status, antepartum -Needs MMR postpartum.  Term labor symptoms and general obstetric precautions including but not limited to vaginal bleeding, contractions, leaking of fluid and fetal movement were reviewed in detail with the patient. Please refer to After Visit Summary for other counseling recommendations.   No follow-ups on file.  No future appointments.  JaElson ClanaRosana HoesSNP

## 2019-03-18 ENCOUNTER — Telehealth: Payer: Self-pay | Admitting: *Deleted

## 2019-03-18 NOTE — Telephone Encounter (Signed)
Left patient a message that the office needs to move her appointment to 03/20/2019 or another date due to provider on 03/19/2019 being out sick.

## 2019-03-19 ENCOUNTER — Encounter: Payer: Self-pay | Admitting: Advanced Practice Midwife

## 2019-03-20 ENCOUNTER — Other Ambulatory Visit: Payer: Self-pay

## 2019-03-20 ENCOUNTER — Ambulatory Visit (INDEPENDENT_AMBULATORY_CARE_PROVIDER_SITE_OTHER): Payer: BLUE CROSS/BLUE SHIELD | Admitting: Obstetrics and Gynecology

## 2019-03-20 ENCOUNTER — Ambulatory Visit (INDEPENDENT_AMBULATORY_CARE_PROVIDER_SITE_OTHER): Payer: Self-pay | Admitting: Pediatrics

## 2019-03-20 DIAGNOSIS — Z7681 Expectant parent(s) prebirth pediatrician visit: Secondary | ICD-10-CM

## 2019-03-20 DIAGNOSIS — Z3A39 39 weeks gestation of pregnancy: Secondary | ICD-10-CM

## 2019-03-20 DIAGNOSIS — Z3403 Encounter for supervision of normal first pregnancy, third trimester: Secondary | ICD-10-CM

## 2019-03-20 NOTE — Progress Notes (Signed)
   PRENATAL VISIT NOTE  Subjective:  Olivia Gardner is a 28 y.o. G1P0 at [redacted]w[redacted]d being seen today for ongoing prenatal care.  She is currently monitored for the following issues for this low-risk pregnancy and has Tendonitis; Elevated LDL cholesterol level; Shortness of breath; Heart burn; Tinnitus; Epicondylitis elbow, medial; Spotting; No energy; Hyperlipidemia; Ganglion of right wrist; Vitamin D deficiency; Low iron stores; Supervision of low-risk first pregnancy; and Rubella non-immune status, antepartum on their problem list.  Patient reports no complaints.  Contractions: Irritability. Vag. Bleeding: None.  Movement: Present. Denies leaking of fluid.   The following portions of the patient's history were reviewed and updated as appropriate: allergies, current medications, past family history, past medical history, past social history, past surgical history and problem list.   Objective:  There were no vitals filed for this visit.  Fetal Status:     Movement: Present     General:  Alert, oriented and cooperative. Patient is in no acute distress.  Skin: Skin is warm and dry. No rash noted.   Cardiovascular: Normal heart rate noted  Respiratory: Normal respiratory effort, no problems with respiration noted  Abdomen: Soft, gravid, appropriate for gestational age.  Pain/Pressure: Present     Pelvic: Cervical exam deferred        Extremities: Normal range of motion.  Edema: Trace  Mental Status: Normal mood and affect. Normal behavior. Normal judgment and thought content.   Assessment and Plan:  Pregnancy: G1P0 at [redacted]w[redacted]d  1. Encounter for supervision of low-risk first pregnancy in third trimester  - Televisit today; patient doing well. BP's reviewed and al WNL -She will come to the office next week for BPP and NST and possibly schedule induction -We reviewed reasons to call the office or go to [facility]; including decreased fetal movement, vaginal bleeding or leaking of water.    There are  no diagnoses linked to this encounter. Term labor symptoms and general obstetric precautions including but not limited to vaginal bleeding, contractions, leaking of fluid and fetal movement were reviewed in detail with the patient. Please refer to After Visit Summary for other counseling recommendations.   Return in about 1 week (around 03/27/2019) for For BPP and NST .  Future Appointments  Date Time Provider Department Center  03/29/2019  8:45 AM Donette Larry, CNM [facility]    Venia Carbon, NP

## 2019-03-20 NOTE — Progress Notes (Signed)
Prenatal counseling for impending newborn done--1st child, currently 39wks  Due 4/3, no complications, early prenatal.  Ok with vaccine policy.  Visit done by phone.  Z76.81

## 2019-03-29 ENCOUNTER — Telehealth (HOSPITAL_COMMUNITY): Payer: Self-pay | Admitting: *Deleted

## 2019-03-29 ENCOUNTER — Ambulatory Visit (INDEPENDENT_AMBULATORY_CARE_PROVIDER_SITE_OTHER): Payer: BLUE CROSS/BLUE SHIELD | Admitting: Certified Nurse Midwife

## 2019-03-29 ENCOUNTER — Other Ambulatory Visit: Payer: Self-pay | Admitting: Certified Nurse Midwife

## 2019-03-29 ENCOUNTER — Other Ambulatory Visit: Payer: Self-pay

## 2019-03-29 ENCOUNTER — Other Ambulatory Visit (HOSPITAL_COMMUNITY): Payer: Self-pay | Admitting: *Deleted

## 2019-03-29 ENCOUNTER — Other Ambulatory Visit: Payer: Self-pay | Admitting: Advanced Practice Midwife

## 2019-03-29 VITALS — BP 119/77 | HR 92 | Wt 157.0 lb

## 2019-03-29 DIAGNOSIS — Z3A40 40 weeks gestation of pregnancy: Secondary | ICD-10-CM

## 2019-03-29 DIAGNOSIS — O480 Post-term pregnancy: Secondary | ICD-10-CM

## 2019-03-29 DIAGNOSIS — Z3403 Encounter for supervision of normal first pregnancy, third trimester: Secondary | ICD-10-CM

## 2019-03-29 NOTE — Telephone Encounter (Signed)
Preadmission screen  

## 2019-03-29 NOTE — Progress Notes (Signed)
Subjective:  Olivia Gardner is a 27 y.o. G1P0 at [redacted]w[redacted]d being seen today for ongoing prenatal care.  She is currently monitored for the following issues for this low-risk pregnancy and has Tendonitis; Elevated LDL cholesterol level; Shortness of breath; Heart burn; Tinnitus; Epicondylitis elbow, medial; Spotting; No energy; Hyperlipidemia; Ganglion of right wrist; Vitamin D deficiency; Low iron stores; Supervision of low-risk first pregnancy; and Rubella non-immune status, antepartum on their problem list.  Patient reports no complaints.  Contractions: Irritability. Vag. Bleeding: None.  Movement: Present. Denies leaking of fluid.   The following portions of the patient's history were reviewed and updated as appropriate: allergies, current medications, past family history, past medical history, past social history, past surgical history and problem list. Problem list updated.  Objective:   Vitals:   03/29/19 0901  BP: 119/77  Pulse: 92  Weight: 71.2 kg    Fetal Status: Fetal Heart Rate (bpm): NST-R   Movement: Present  Presentation: Vertex  General:  Alert, oriented and cooperative. Patient is in no acute distress.  Skin: Skin is warm and dry. No rash noted.   Cardiovascular: Normal heart rate noted  Respiratory: Normal respiratory effort, no problems with respiration noted  Abdomen: Soft, gravid, appropriate for gestational age. Pain/Pressure: Present     Pelvic: Vag. Bleeding: None Vag D/C Character: Thin   Cervical exam performed Dilation: Fingertip Effacement (%): Thick    Extremities: Normal range of motion.  Edema: Trace  Mental Status: Normal mood and affect. Normal behavior. Normal judgment and thought content.   Urinalysis:      Assessment and Plan:  Pregnancy: G1P0 at [redacted]w[redacted]d  1. Encounter for supervision of low-risk first pregnancy in third trimester  2. Post-term pregnancy, 40-42 weeks of gestation - NST reactive, AFI 13cm - IOL scheduled for 4/5  Term labor symptoms  and general obstetric precautions including but not limited to vaginal bleeding, contractions, leaking of fluid and fetal movement were reviewed in detail with the patient. Please refer to After Visit Summary for other counseling recommendations.  Return in about 2 days (around 03/31/2019).   Donette Larry, CNM

## 2019-03-29 NOTE — Addendum Note (Signed)
Addended by: Donette Larry E on: 03/29/2019 03:18 PM   Modules accepted: Orders, SmartSet

## 2019-03-31 ENCOUNTER — Other Ambulatory Visit: Payer: Self-pay

## 2019-03-31 ENCOUNTER — Inpatient Hospital Stay (HOSPITAL_COMMUNITY)
Admission: AD | Admit: 2019-03-31 | Discharge: 2019-04-03 | DRG: 805 | Disposition: A | Discharge status: Home / Self Care | Payer: BLUE CROSS/BLUE SHIELD | Attending: Obstetrics and Gynecology | Admitting: Obstetrics and Gynecology

## 2019-03-31 ENCOUNTER — Encounter (HOSPITAL_COMMUNITY): Payer: Self-pay

## 2019-03-31 ENCOUNTER — Inpatient Hospital Stay (HOSPITAL_COMMUNITY): Payer: BLUE CROSS/BLUE SHIELD | Admitting: Anesthesiology

## 2019-03-31 ENCOUNTER — Inpatient Hospital Stay (HOSPITAL_COMMUNITY): Payer: BLUE CROSS/BLUE SHIELD

## 2019-03-31 DIAGNOSIS — R94120 Abnormal auditory function study: Secondary | ICD-10-CM | POA: Diagnosis not present

## 2019-03-31 DIAGNOSIS — O411230 Chorioamnionitis, third trimester, not applicable or unspecified: Secondary | ICD-10-CM | POA: Diagnosis present

## 2019-03-31 DIAGNOSIS — Z3A40 40 weeks gestation of pregnancy: Secondary | ICD-10-CM

## 2019-03-31 DIAGNOSIS — O480 Post-term pregnancy: Secondary | ICD-10-CM | POA: Diagnosis not present

## 2019-03-31 DIAGNOSIS — Z23 Encounter for immunization: Secondary | ICD-10-CM | POA: Diagnosis not present

## 2019-03-31 DIAGNOSIS — Z412 Encounter for routine and ritual male circumcision: Secondary | ICD-10-CM | POA: Diagnosis not present

## 2019-03-31 DIAGNOSIS — Z3A41 41 weeks gestation of pregnancy: Secondary | ICD-10-CM | POA: Diagnosis not present

## 2019-03-31 HISTORY — DX: Vitamin D deficiency, unspecified: E55.9

## 2019-03-31 LAB — CBC
HCT: 37.1 % (ref 36.0–46.0)
Hemoglobin: 11.8 g/dL — ABNORMAL LOW (ref 12.0–15.0)
MCH: 28.7 pg (ref 26.0–34.0)
MCHC: 31.8 g/dL (ref 30.0–36.0)
MCV: 90.3 fL (ref 80.0–100.0)
Platelets: 150 10*3/uL (ref 150–400)
RBC: 4.11 MIL/uL (ref 3.87–5.11)
RDW: 15.4 % (ref 11.5–15.5)
WBC: 10 10*3/uL (ref 4.0–10.5)
nRBC: 0 % (ref 0.0–0.2)

## 2019-03-31 LAB — TYPE AND SCREEN
ABO/RH(D): O POS
Antibody Screen: NEGATIVE

## 2019-03-31 MED ORDER — LIDOCAINE HCL (PF) 1 % IJ SOLN
30.0000 mL | INTRAMUSCULAR | Status: AC | PRN
  Administered 2019-04-01: 16:00:00 30 mL via SUBCUTANEOUS
  Filled 2019-03-31 (×2): qty 30

## 2019-03-31 MED ORDER — ACETAMINOPHEN 325 MG PO TABS
650.0000 mg | ORAL_TABLET | ORAL | Status: DC | PRN
  Administered 2019-04-01: 05:00:00 650 mg via ORAL
  Filled 2019-03-31: qty 2

## 2019-03-31 MED ORDER — ONDANSETRON HCL 4 MG/2ML IJ SOLN
4.0000 mg | Freq: Four times a day (QID) | INTRAMUSCULAR | Status: DC | PRN
  Administered 2019-03-31 – 2019-04-01 (×2): 4 mg via INTRAVENOUS
  Filled 2019-03-31 (×2): qty 2

## 2019-03-31 MED ORDER — OXYTOCIN 40 UNITS IN NORMAL SALINE INFUSION - SIMPLE MED
1.0000 m[IU]/min | INTRAVENOUS | Status: DC
  Administered 2019-03-31: 10:00:00 2 m[IU]/min via INTRAVENOUS
  Filled 2019-03-31: qty 1000

## 2019-03-31 MED ORDER — OXYCODONE-ACETAMINOPHEN 5-325 MG PO TABS: 2.0000 | ORAL_TABLET | ORAL | Status: DC | PRN

## 2019-03-31 MED ORDER — LACTATED RINGERS IV SOLN
INTRAVENOUS | Status: DC
  Administered 2019-03-31 – 2019-04-01 (×3): via INTRAVENOUS

## 2019-03-31 MED ORDER — SODIUM CHLORIDE (PF) 0.9 % IJ SOLN
INTRAMUSCULAR | Status: DC | PRN
  Administered 2019-03-31: 14 mL/h via EPIDURAL

## 2019-03-31 MED ORDER — LACTATED RINGERS IV SOLN
500.0000 mL | INTRAVENOUS | Status: DC | PRN
  Administered 2019-03-31: 21:00:00 500 mL via INTRAVENOUS

## 2019-03-31 MED ORDER — DIPHENHYDRAMINE HCL 50 MG/ML IJ SOLN
12.5000 mg | INTRAMUSCULAR | Status: DC | PRN
  Administered 2019-04-01 (×2): 12.5 mg via INTRAVENOUS
  Filled 2019-03-31 (×2): qty 1

## 2019-03-31 MED ORDER — PHENYLEPHRINE 40 MCG/ML (10ML) SYRINGE FOR IV PUSH (FOR BLOOD PRESSURE SUPPORT): 80.0000 ug | PREFILLED_SYRINGE | INTRAVENOUS | Status: DC | PRN

## 2019-03-31 MED ORDER — OXYCODONE-ACETAMINOPHEN 5-325 MG PO TABS: 1.0000 | ORAL_TABLET | ORAL | Status: DC | PRN

## 2019-03-31 MED ORDER — LIDOCAINE HCL (PF) 1 % IJ SOLN
INTRAMUSCULAR | Status: DC | PRN
  Administered 2019-03-31: 6 mL via EPIDURAL

## 2019-03-31 MED ORDER — FENTANYL CITRATE (PF) 100 MCG/2ML IJ SOLN
100.0000 ug | INTRAMUSCULAR | Status: DC | PRN
  Administered 2019-03-31 (×2): 100 ug via INTRAVENOUS
  Filled 2019-03-31 (×2): qty 2

## 2019-03-31 MED ORDER — OXYTOCIN 40 UNITS IN NORMAL SALINE INFUSION - SIMPLE MED
INTRAVENOUS | Status: AC
  Administered 2019-03-31: 2 m[IU]/min via INTRAVENOUS
  Filled 2019-03-31: qty 1000

## 2019-03-31 MED ORDER — OXYTOCIN 40 UNITS IN NORMAL SALINE INFUSION - SIMPLE MED: 2.5000 [IU]/h | INTRAVENOUS | Status: DC

## 2019-03-31 MED ORDER — EPHEDRINE 5 MG/ML INJ: 10.0000 mg | INTRAVENOUS | Status: DC | PRN

## 2019-03-31 MED ORDER — PHENYLEPHRINE 40 MCG/ML (10ML) SYRINGE FOR IV PUSH (FOR BLOOD PRESSURE SUPPORT)
80.0000 ug | PREFILLED_SYRINGE | INTRAVENOUS | Status: DC | PRN
  Filled 2019-03-31: qty 10

## 2019-03-31 MED ORDER — OXYTOCIN BOLUS FROM INFUSION
500.0000 mL | Freq: Once | INTRAVENOUS | Status: AC
  Administered 2019-04-01: 500 mL via INTRAVENOUS

## 2019-03-31 MED ORDER — FENTANYL-BUPIVACAINE-NACL 0.5-0.125-0.9 MG/250ML-% EP SOLN
12.0000 mL/h | EPIDURAL | Status: DC | PRN
  Administered 2019-04-01: 08:00:00 12 mL/h via EPIDURAL
  Filled 2019-03-31 (×2): qty 250

## 2019-03-31 MED ORDER — SOD CITRATE-CITRIC ACID 500-334 MG/5ML PO SOLN
30.0000 mL | ORAL | Status: DC | PRN
  Administered 2019-03-31: 23:00:00 30 mL via ORAL
  Filled 2019-03-31 (×2): qty 15

## 2019-03-31 MED ORDER — TERBUTALINE SULFATE 1 MG/ML IJ SOLN: 0.2500 mg | Freq: Once | INTRAMUSCULAR | Status: DC | PRN

## 2019-03-31 MED ORDER — LACTATED RINGERS IV SOLN
500.0000 mL | Freq: Once | INTRAVENOUS | Status: AC
  Administered 2019-03-31: 18:00:00 500 mL via INTRAVENOUS

## 2019-03-31 NOTE — Progress Notes (Signed)
LABOR PROGRESS NOTE  Olivia Gardner is a 28 y.o. G1P0 at [redacted]w[redacted]d  admitted for IOL for postdates.   Subjective: Strip note.   Objective: BP 121/80   Pulse 68   Temp 98.3 F (36.8 C) (Oral)   Resp 18   Ht 5' 0.5" (1.537 m)   Wt 72.6 kg   LMP 06/18/2018 (Exact Date)   BMI 30.73 kg/m  or  Vitals:   03/31/19 1340 03/31/19 1341 03/31/19 1400 03/31/19 1431  BP:  130/82 122/82 121/80  Pulse: 72 72 61 68  Resp:      Temp:      TempSrc:      Weight:      Height:        Dilation: 2 Effacement (%): 50 Cervical Position: Middle, Posterior(left) Station: Ballotable Presentation: Vertex Exam by:: Martyn Malay, RN FHT: baseline rate 125, moderate varibility, +acel, no decel Toco: q2-4 min   Labs: Lab Results  Component Value Date   WBC 10.0 03/31/2019   HGB 11.8 (L) 03/31/2019   HCT 37.1 03/31/2019   MCV 90.3 03/31/2019   PLT 150 03/31/2019    Patient Active Problem List   Diagnosis Date Noted  . Post-dates pregnancy 03/31/2019  . Rubella non-immune status, antepartum 09/01/2018  . Supervision of low-risk first pregnancy 08/28/2018  . Vitamin D deficiency 03/28/2016  . Low iron stores 03/28/2016  . No energy 03/25/2016  . Hyperlipidemia 03/25/2016  . Ganglion of right wrist 03/25/2016  . Spotting 03/30/2015  . Epicondylitis elbow, medial 02/02/2015  . Tendonitis 01/30/2015  . Elevated LDL cholesterol level 01/30/2015  . Shortness of breath 01/30/2015  . Heart burn 01/30/2015  . Tinnitus 01/30/2015    Assessment / Plan: 28 y.o. G1P0 at [redacted]w[redacted]d here for IOL for postdates.   Labor: Induction. Currently on Pitocin at 10 mu/min, continue to titrate as appropriate.  Fetal Wellbeing:  Cat I  Pain Control:  Epidural upon maternal request  Anticipated MOD:  NSVD  Marcy Siren, D.O. OB Fellow  03/31/2019, 2:37 PM

## 2019-03-31 NOTE — Progress Notes (Signed)
LABOR PROGRESS NOTE  Olivia Gardner is a 28 y.o. G1P0 at [redacted]w[redacted]d  admitted for IOL for PD  Subjective: Patient doing well, comfortable with epidural   Objective: BP 103/62   Pulse 75   Temp 97.8 F (36.6 C) (Axillary)   Resp 18   Ht 5' 0.5" (1.537 m)   Wt 72.6 kg   LMP 06/18/2018 (Exact Date)   SpO2 100%   BMI 30.73 kg/m  or  Vitals:   03/31/19 2100 03/31/19 2105 03/31/19 2110 03/31/19 2115  BP: 103/62     Pulse: 75     Resp: 18     Temp:      TempSrc:      SpO2: 100% 100% 100% 100%  Weight:      Height:        AROM @ 2024- light meconium  Dilation: 6 Effacement (%): 80 Cervical Position: Middle Station: -1 Presentation: Vertex Exam by:: Lanice Shirts CNM  FHT: baseline rate 150, moderate varibility, +accel, early decel Toco: 3-4  Labs: Lab Results  Component Value Date   WBC 10.0 03/31/2019   HGB 11.8 (L) 03/31/2019   HCT 37.1 03/31/2019   MCV 90.3 03/31/2019   PLT 150 03/31/2019    Patient Active Problem List   Diagnosis Date Noted  . Post-dates pregnancy 03/31/2019  . Rubella non-immune status, antepartum 09/01/2018  . Supervision of low-risk first pregnancy 08/28/2018  . Vitamin D deficiency 03/28/2016  . Low iron stores 03/28/2016  . No energy 03/25/2016  . Hyperlipidemia 03/25/2016  . Ganglion of right wrist 03/25/2016  . Spotting 03/30/2015  . Epicondylitis elbow, medial 02/02/2015  . Tendonitis 01/30/2015  . Elevated LDL cholesterol level 01/30/2015  . Shortness of breath 01/30/2015  . Heart burn 01/30/2015  . Tinnitus 01/30/2015    Assessment / Plan: 28 y.o. G1P0 at [redacted]w[redacted]d here for IOL for PD   Labor: Continue pitocin titration, AROM @ 2024 Fetal Wellbeing:  Cat I  Pain Control:  Epidural  Anticipated MOD:  SVD   Sharyon Cable, CNM 03/31/2019, 9:21 PM

## 2019-03-31 NOTE — H&P (Signed)
LABOR AND DELIVERY ADMISSION HISTORY AND PHYSICAL NOTE  Olivia Gardner is a 28 y.o. female G1P0 with IUP at [redacted]w[redacted]d by LMP c/w 10 week sono presenting for IOL for postdates.  She reports positive fetal movement. She denies leakage of fluid or vaginal bleeding.  Prenatal History/Complications: PNC at [facility] Pregnancy complications:  - rubella non-immune   Past Medical History: Past Medical History:  Diagnosis Date  . Hyperlipidemia     Past Surgical History: Past Surgical History:  Procedure Laterality Date  . WISDOM TOOTH EXTRACTION      Obstetrical History: OB History    Gravida  1   Para      Term      Preterm      AB      Living  0     SAB      TAB      Ectopic      Multiple      Live Births              Social History: Social History   Socioeconomic History  . Marital status: Single    Spouse name: Not on file  . Number of children: Not on file  . Years of education: Not on file  . Highest education level: Not on file  Occupational History  . Not on file  Social Needs  . Financial resource strain: Not hard at all  . Food insecurity:    Worry: Not on file    Inability: Not on file  . Transportation needs:    Medical: Not on file    Non-medical: Not on file  Tobacco Use  . Smoking status: Never Smoker  . Smokeless tobacco: Never Used  Substance and Sexual Activity  . Alcohol use: Not Currently    Alcohol/week: 0.0 standard drinks  . Drug use: No  . Sexual activity: Yes    Birth control/protection: None  Lifestyle  . Physical activity:    Days per week: Not on file    Minutes per session: Not on file  . Stress: Not on file  Relationships  . Social connections:    Talks on phone: Not on file    Gets together: Not on file    Attends religious service: Not on file    Active member of club or organization: Not on file    Attends meetings of clubs or organizations: Not on file    Relationship status: Not on file  Other Topics Concern   . Not on file  Social History Narrative  . Not on file    Family History: Family History  Problem Relation Age of Onset  . Hyperlipidemia Mother   . Hypertension Father   . Diabetes Maternal Grandmother   . Diabetes Paternal Grandmother   . Asthma Neg Hx   . Stroke Neg Hx     Allergies: Allergies  Allergen Reactions  . Sulfacetamide Sodium Other (See Comments)    Per Patient caused damage to her eye to the point she has to get new prescription lenses.  Her eye doctor wanted it noted she can not take these any more.    Medications Prior to Admission  Medication Sig Dispense Refill Last Dose  . calcium carbonate (TUMS - DOSED IN MG ELEMENTAL CALCIUM) 500 MG chewable tablet Chew 1 tablet by mouth daily.   Taking  . ferrous sulfate 325 (65 FE) MG EC tablet Take 1 tablet (325 mg total) by mouth daily with breakfast. 90 tablet 1 Taking  .  loratadine (CLARITIN) 10 MG tablet Take 10 mg by mouth daily.   Taking  . Prenatal Vit-Fe Fumarate-FA (PRENATAL VITAMIN PO) Take 1 tablet by mouth daily.    Taking  . VITAMIN D PO Take 1 tablet by mouth daily.   Taking     Review of Systems  All systems reviewed and negative except as stated in HPI  Physical Exam Blood pressure 127/84, pulse 98, temperature 97.9 F (36.6 C), temperature source Oral, resp. rate 16, last menstrual period 06/18/2018. General appearance: alert, oriented, NAD Lungs: normal respiratory effort Heart: regular rate Abdomen: soft, non-tender; gravid, FH appropriate for GA Extremities: No calf swelling or tenderness Presentation: cephalic Fetal monitoring: 135 bpm, moderate variability, +acels, no decels  Uterine activity: Irregular, occasional     Prenatal labs: ABO, Rh: O/RH(D) POSITIVE/-- (09/03 1614) Antibody: NO ANTIBODIES DETECTED (09/03 1614) Rubella: <0.90 (09/03 1614) RPR: NON-REACTIVE (01/02 0751)  HBsAg: NON-REACTIVE (09/03 1614)  HIV: NON-REACTIVE (01/02 0751)  GC/Chlamydia: Negative  GBS:    Negative  2-hr GTT: Normal  Genetic screening:  Negative  Anatomy US: Normal   Prenatal Transfer Tool  Maternal Diabetes: No Genetic Screening: Normal Maternal Ultrasounds/Referrals: Normal Fetal Ultrasounds or other Referrals:  None Maternal Substance Abuse:  No Significant Maternal Medications:  None Significant Maternal Lab Results: Lab values include: Group B Strep negative  No results found for this or any previous visit (from the past 24 hour(s)).  Patient Active Problem List   Diagnosis Date Noted  . Post-dates pregnancy 03/31/2019  . Rubella non-immune status, antepartum 09/01/2018  . Supervision of low-risk first pregnancy 08/28/2018  . Vitamin D deficiency 03/28/2016  . Low iron stores 03/28/2016  . No energy 03/25/2016  . Hyperlipidemia 03/25/2016  . Ganglion of right wrist 03/25/2016  . Spotting 03/30/2015  . Epicondylitis elbow, medial 02/02/2015  . Tendonitis 01/30/2015  . Elevated LDL cholesterol level 01/30/2015  . Shortness of breath 01/30/2015  . Heart burn 01/30/2015  . Tinnitus 01/30/2015    Assessment: Olivia Gardner is a 28 y.o. G1P0 at [redacted]w[redacted]d here for IOL for postdates.   #Labor: Cervix favorable at time of induction. Will start with pitocin 2x2, titrate as appropriate.  #Pain: Epidural upon maternal request  [name]: Cat I  #ID:  GBS neg  #MOF: Breast  #MOC:OCPs vs. POPs  #Circ:  Yes, inpatient   De Hollingshead 03/31/2019, 9:12 AM

## 2019-03-31 NOTE — Anesthesia Procedure Notes (Signed)
Epidural Patient location during procedure: OB Start time: 03/31/2019 5:51 PM End time: 03/31/2019 5:55 PM  Staffing Anesthesiologist: Bethena Midget, MD  Preanesthetic Checklist Completed: patient identified, site marked, surgical consent, pre-op evaluation, timeout performed, IV checked, risks and benefits discussed and monitors and equipment checked  Epidural Patient position: sitting Prep: site prepped and draped and DuraPrep Patient monitoring: continuous pulse ox and blood pressure Approach: midline Location: L3-L4 Injection technique: LOR air  Needle:  Needle type: Tuohy  Needle gauge: 17 G Needle length: 9 cm and 9 Needle insertion depth: 6 cm Catheter type: closed end flexible Catheter size: 19 Gauge Catheter at skin depth: 11 cm Test dose: negative  Assessment Events: blood not aspirated, injection not painful, no injection resistance, negative IV test and no paresthesia

## 2019-03-31 NOTE — Plan of Care (Signed)
  Problem: Health Behavior/Discharge Planning: Goal: Ability to manage health-related needs will improve Outcome: Progressing   Problem: Clinical Measurements: Goal: Ability to maintain clinical measurements within normal limits will improve Outcome: Progressing Goal: Will remain free from infection Outcome: Progressing Goal: Diagnostic test results will improve Outcome: Progressing Goal: Respiratory complications will improve Outcome: Progressing Goal: Cardiovascular complication will be avoided Outcome: Progressing   Problem: Activity: Goal: Risk for activity intolerance will decrease Outcome: Progressing   Problem: Coping: Goal: Level of anxiety will decrease Outcome: Progressing   Problem: Elimination: Goal: Will not experience complications related to bowel motility Outcome: Progressing Goal: Will not experience complications related to urinary retention Outcome: Progressing   Problem: Pain Managment: Goal: General experience of comfort will improve Outcome: Progressing   Problem: Safety: Goal: Ability to remain free from injury will improve Outcome: Progressing   Problem: Skin Integrity: Goal: Risk for impaired skin integrity will decrease Outcome: Progressing   Problem: Coping: Goal: Ability to verbalize concerns and feelings about labor and delivery will improve Outcome: Progressing   Problem: Life Cycle: Goal: Ability to make normal progression through stages of labor will improve Outcome: Progressing Goal: Ability to effectively push during vaginal delivery will improve Outcome: Progressing   Problem: Role Relationship: Goal: Will demonstrate positive interactions with the child Outcome: Progressing   Problem: Safety: Goal: Risk of complications during labor and delivery will decrease Outcome: Progressing   Problem: Pain Management: Goal: Relief or control of pain from uterine contractions will improve Outcome: Progressing

## 2019-03-31 NOTE — Anesthesia Preprocedure Evaluation (Signed)
Anesthesia Evaluation  Patient identified by MRN, date of birth, ID band Patient awake    Reviewed: Allergy & Precautions, H&P , NPO status , Patient's Chart, lab work & pertinent test results, reviewed documented beta blocker date and time   Airway Mallampati: II  TM Distance: >3 FB Neck ROM: full    Dental no notable dental hx.    Pulmonary neg pulmonary ROS,    Pulmonary exam normal breath sounds clear to auscultation       Cardiovascular negative cardio ROS Normal cardiovascular exam Rhythm:regular Rate:Normal     Neuro/Psych negative neurological ROS  negative psych ROS   GI/Hepatic negative GI ROS, Neg liver ROS,   Endo/Other  negative endocrine ROS  Renal/GU negative Renal ROS  negative genitourinary   Musculoskeletal   Abdominal   Peds  Hematology negative hematology ROS (+)   Anesthesia Other Findings   Reproductive/Obstetrics (+) Pregnancy                             Anesthesia Physical Anesthesia Plan  ASA: II  Anesthesia Plan: Epidural   Post-op Pain Management:    Induction:   PONV Risk Score and Plan:   Airway Management Planned:   Additional Equipment:   Intra-op Plan:   Post-operative Plan:   Informed Consent: I have reviewed the patients History and Physical, chart, labs and discussed the procedure including the risks, benefits and alternatives for the proposed anesthesia with the patient or authorized representative who has indicated his/her understanding and acceptance.     Dental Advisory Given  Plan Discussed with: Anesthesiologist  Anesthesia Plan Comments: (Labs checked- platelets confirmed with RN in room. Fetal heart tracing, per RN, reported to be stable enough for sitting procedure. Discussed epidural, and patient consents to the procedure:  included risk of possible headache,backache, failed block, allergic reaction, and nerve injury. This  patient was asked if she had any questions or concerns before the procedure started.)        Anesthesia Quick Evaluation  

## 2019-04-01 ENCOUNTER — Encounter (HOSPITAL_COMMUNITY): Payer: Self-pay | Admitting: *Deleted

## 2019-04-01 DIAGNOSIS — Z3A40 40 weeks gestation of pregnancy: Secondary | ICD-10-CM

## 2019-04-01 DIAGNOSIS — O480 Post-term pregnancy: Secondary | ICD-10-CM

## 2019-04-01 LAB — ABO/RH: ABO/RH(D): O POS

## 2019-04-01 LAB — RPR: RPR Ser Ql: NONREACTIVE

## 2019-04-01 MED ORDER — ONDANSETRON HCL 4 MG/2ML IJ SOLN: 4.0000 mg | INTRAMUSCULAR | Status: DC | PRN

## 2019-04-01 MED ORDER — GENTAMICIN SULFATE 40 MG/ML IJ SOLN
5.0000 mg/kg | INTRAVENOUS | Status: DC
  Administered 2019-04-01: 08:00:00 360 mg via INTRAVENOUS
  Filled 2019-04-01 (×2): qty 9

## 2019-04-01 MED ORDER — DIPHENHYDRAMINE HCL 25 MG PO CAPS: 25.0000 mg | ORAL_CAPSULE | Freq: Four times a day (QID) | ORAL | Status: DC | PRN

## 2019-04-01 MED ORDER — SIMETHICONE 80 MG PO CHEW: 80.0000 mg | CHEWABLE_TABLET | ORAL | Status: DC | PRN

## 2019-04-01 MED ORDER — FENTANYL-BUPIVACAINE-NACL 0.5-0.125-0.9 MG/250ML-% EP SOLN: 12.0000 mL/h | EPIDURAL | Status: DC | PRN

## 2019-04-01 MED ORDER — BENZOCAINE-MENTHOL 20-0.5 % EX AERO
1.0000 "application " | INHALATION_SPRAY | CUTANEOUS | Status: DC | PRN
  Administered 2019-04-01: 1 via TOPICAL
  Filled 2019-04-01: qty 56

## 2019-04-01 MED ORDER — OXYCODONE-ACETAMINOPHEN 5-325 MG PO TABS: 2.0000 | ORAL_TABLET | ORAL | Status: DC | PRN

## 2019-04-01 MED ORDER — FERROUS SULFATE 325 (65 FE) MG PO TABS
325.0000 mg | ORAL_TABLET | Freq: Two times a day (BID) | ORAL | Status: DC
  Administered 2019-04-02 – 2019-04-03 (×3): 325 mg via ORAL
  Filled 2019-04-01 (×3): qty 1

## 2019-04-01 MED ORDER — SODIUM CHLORIDE 0.9 % IV SOLN
2.0000 g | Freq: Four times a day (QID) | INTRAVENOUS | Status: DC
  Administered 2019-04-01 (×2): 2 g via INTRAVENOUS
  Filled 2019-04-01 (×5): qty 2000

## 2019-04-01 MED ORDER — IBUPROFEN 600 MG PO TABS
600.0000 mg | ORAL_TABLET | Freq: Four times a day (QID) | ORAL | Status: DC
  Administered 2019-04-01 – 2019-04-03 (×8): 600 mg via ORAL
  Filled 2019-04-01 (×8): qty 1

## 2019-04-01 MED ORDER — DIPHENHYDRAMINE HCL 50 MG/ML IJ SOLN: 12.5000 mg | INTRAMUSCULAR | Status: DC | PRN

## 2019-04-01 MED ORDER — MEASLES, MUMPS & RUBELLA VAC IJ SOLR
0.5000 mL | Freq: Once | INTRAMUSCULAR | Status: AC
  Administered 2019-04-03: 12:00:00 0.5 mL via SUBCUTANEOUS
  Filled 2019-04-01: qty 0.5

## 2019-04-01 MED ORDER — ONDANSETRON HCL 4 MG PO TABS: 4.0000 mg | ORAL_TABLET | ORAL | Status: DC | PRN

## 2019-04-01 MED ORDER — ACETAMINOPHEN 325 MG PO TABS: 650.0000 mg | ORAL_TABLET | ORAL | Status: DC | PRN

## 2019-04-01 MED ORDER — TETANUS-DIPHTH-ACELL PERTUSSIS 5-2.5-18.5 LF-MCG/0.5 IM SUSP: 0.5000 mL | Freq: Once | INTRAMUSCULAR | Status: DC

## 2019-04-01 MED ORDER — COCONUT OIL OIL: 1.0000 "application " | TOPICAL_OIL | Status: DC | PRN

## 2019-04-01 MED ORDER — LACTATED RINGERS AMNIOINFUSION
INTRAVENOUS | Status: DC
  Administered 2019-04-01: 01:00:00 1 mL via INTRAUTERINE

## 2019-04-01 MED ORDER — OXYCODONE-ACETAMINOPHEN 5-325 MG PO TABS: 1.0000 | ORAL_TABLET | ORAL | Status: DC | PRN

## 2019-04-01 MED ORDER — PRENATAL MULTIVITAMIN CH
1.0000 | ORAL_TABLET | Freq: Every day | ORAL | Status: DC
  Administered 2019-04-02 – 2019-04-03 (×2): 1 via ORAL
  Filled 2019-04-01 (×2): qty 1

## 2019-04-01 MED ORDER — MAGNESIUM HYDROXIDE 400 MG/5ML PO SUSP: 30.0000 mL | ORAL | Status: DC | PRN

## 2019-04-01 MED ORDER — WITCH HAZEL-GLYCERIN EX PADS: 1.0000 "application " | MEDICATED_PAD | CUTANEOUS | Status: DC | PRN

## 2019-04-01 MED ORDER — DIBUCAINE 1 % RE OINT: 1.0000 "application " | TOPICAL_OINTMENT | RECTAL | Status: DC | PRN

## 2019-04-01 NOTE — Progress Notes (Signed)
LABOR PROGRESS NOTE  Olivia Gardner is a 28 y.o. G1P0 at [redacted]w[redacted]d  admitted for IOL for PD   Subjective: Patient comfortable with epidural, trying to rest in between pressure in bottom   Objective: BP 117/85   Pulse 77   Temp 97.9 F (36.6 C) (Axillary)   Resp 18   Ht 5' 0.5" (1.537 m)   Wt 72.6 kg   LMP 06/18/2018 (Exact Date)   SpO2 98%   BMI 30.73 kg/m  or  Vitals:   04/01/19 0025 04/01/19 0035 04/01/19 0040 04/01/19 0045  BP:      Pulse:      Resp:      Temp:      TempSrc:      SpO2: 100% 99% 98% 98%  Weight:      Height:        IUPC placed @ 0114 due to repetitive variables   Dilation: 9 Effacement (%): 90 Cervical Position: Middle Station: 0 Presentation: Vertex Exam by:: Lanice Shirts CNM  FHT: baseline rate 140, moderate varibility, +accel, variable decel Toco: 2-3   Labs: Lab Results  Component Value Date   WBC 10.0 03/31/2019   HGB 11.8 (L) 03/31/2019   HCT 37.1 03/31/2019   MCV 90.3 03/31/2019   PLT 150 03/31/2019    Patient Active Problem List   Diagnosis Date Noted  . Post-dates pregnancy 03/31/2019  . Rubella non-immune status, antepartum 09/01/2018  . Supervision of low-risk first pregnancy 08/28/2018  . Vitamin D deficiency 03/28/2016  . Low iron stores 03/28/2016  . No energy 03/25/2016  . Hyperlipidemia 03/25/2016  . Ganglion of right wrist 03/25/2016  . Spotting 03/30/2015  . Epicondylitis elbow, medial 02/02/2015  . Tendonitis 01/30/2015  . Elevated LDL cholesterol level 01/30/2015  . Shortness of breath 01/30/2015  . Heart burn 01/30/2015  . Tinnitus 01/30/2015    Assessment / Plan: 28 y.o. G1P0 at [redacted]w[redacted]d here for IOL for PD   Labor: Expectant management  Fetal Wellbeing:  Cat II  Pain Control:  Epidural  Anticipated MOD:  SVD  Sharyon Cable, CNM 04/01/2019, 1:24 AM

## 2019-04-01 NOTE — Progress Notes (Signed)
ANTIBIOTIC CONSULT NOTE - INITIAL  Pharmacy Consult for Gentamicin Indication: Chorioamnionitis   Allergies  Allergen Reactions  . Sulfacetamide Sodium Other (See Comments)    Per Patient caused damage to her eye to the point she has to get new prescription lenses.  Her eye doctor wanted it noted she can not take these any more.    Patient Measurements: Height: 5' 0.5" (153.7 cm) Weight: 160 lb (72.6 kg) IBW/kg (Calculated) : 46.65   Vital Signs: Temp: 100.3 F (37.9 C) (04/06 0620) Temp Source: Axillary (04/06 0620) BP: 125/79 (04/06 0601) Pulse Rate: 78 (04/06 0601)  Labs: Recent Labs    03/31/19 0927  WBC 10.0  HGB 11.8*  PLT 150    Medications:  Ampicillin 2 gm IV q6hr  Goal of Therapy:  Gentamicin peak 25-30 mg/L and Trough < 1 mg/L  Plan:    Gentamicin 360 mg (5mg /kg) IV every 24 hrs  Check Scr with next labs if gentamicin continued. Will check gentamicin levels if continued > 72hr or clinically indicated.  Sherrilyn Rist 04/01/2019,6:40 AM

## 2019-04-01 NOTE — Progress Notes (Signed)
Subjective: Olivia Gardner is a 28 y.o. G1P0 at [redacted]w[redacted]d by ultrasound admitted for induction of labor due to Post dates. Due date 03/25/2019. Patient very tearful stating that she is feeling "so much pain just on the LT side of perineum."  Objective: BP 125/79   Pulse 92   Temp 98 F (36.7 C) (Oral)   Resp 18   Ht 5' 0.5" (1.537 m)   Wt 72.6 kg   LMP 06/18/2018 (Exact Date)   SpO2 99%   BMI 30.73 kg/m  I/O last 3 completed shifts: In: 260.4 [P.O.:120; I.V.:140.4] Out: 600 [Urine:600] No intake/output data recorded.  FHT:  FHR: 155 bpm, variability: moderate,  accelerations:  Present,  decelerations:  Absent UC:   regular, every 2-4 minutes SVE:   Dilation: Lip/rim Effacement (%): 90 Station: Plus 1, Plus 2 Exam by:: Raelyn Mora, CNM  Labs: Lab Results  Component Value Date   WBC 10.0 03/31/2019   HGB 11.8 (L) 03/31/2019   HCT 37.1 03/31/2019   MCV 90.3 03/31/2019   PLT 150 03/31/2019    Assessment / Plan: Induction of labor due to postdates,  progressing well on pitocin  Labor: Progressing normally, prolonged Preeclampsia:  n/a Fetal Wellbeing:  Category I Pain Control:  Epidural I/D:  n/a Anticipated MOD:  NSVD  Raelyn Mora, MSN, CNM 04/01/2019, 11:10 AM

## 2019-04-01 NOTE — Discharge Summary (Signed)
Postpartum Discharge Summary     Patient Name: Olivia Gardner DOB: Jun 19, 1991 MRN: [id number]  Date of admission: 03/31/2019 Delivering Provider: Calvert Cantor   Date of discharge: 04/03/2019  Admitting diagnosis: pregnancy Intrauterine pregnancy: [redacted]w[redacted]d     Secondary diagnosis:  Active Problems:   Post-dates pregnancy  Additional problems: Particulate meconium, Rubella non-immune. Antibiotics given in labor for Triple I as evidenced by maternal fever     Discharge diagnosis: Term Pregnancy Delivered                                                                                                Post partum procedures:None  Augmentation: AROM and Pitocin  Complications: None  Hospital course:  Induction of Labor With Vaginal Delivery   28 y.o. yo G1P0 at [redacted]w[redacted]d was admitted to the hospital 03/31/2019 for induction of labor.  Indication for induction: Postdates.  Patient had an uncomplicated labor course as follows: Membrane Rupture Time/Date: 8:24 PM ,03/31/2019   Intrapartum Procedures: Episiotomy: None [1]                                         Lacerations:  2nd degree [3]  Patient had delivery of a Viable infant.  Information for the patient's newborn:  Onika, Emig [[id number]]  Delivery Method: Vaginal, Spontaneous(Filed from Delivery Summary)   04/01/2019  Details of delivery can be found in separate delivery note.  Patient had a routine postpartum course. Patient is discharged home 04/03/19.  Magnesium Sulfate received: No BMZ received: No  Physical exam  Vitals:   04/02/19 0348 04/02/19 0723 04/02/19 1402 04/02/19 2155  BP: 108/68 109/78 120/73 105/66  Pulse: 80 70 72 74  Resp: 18 16 16 18   Temp: 97.7 F (36.5 C) 98.1 F (36.7 C) 97.6 F (36.4 C) 98.4 F (36.9 C)  TempSrc: Oral Oral Oral Oral  SpO2:   97% 99%  Weight:      Height:       General: alert, cooperative and no distress Lochia: appropriate Uterine Fundus: firm Incision: N/A DVT  Evaluation: No evidence of DVT seen on physical exam. Labs: Lab Results  Component Value Date   WBC 10.0 03/31/2019   HGB 11.8 (L) 03/31/2019   HCT 37.1 03/31/2019   MCV 90.3 03/31/2019   PLT 150 03/31/2019   CMP Latest Ref Rng & Units 12/09/2018  Glucose 70 - 99 mg/dL 86  BUN 6 - 20 mg/dL 6  Creatinine 8.88 - 7.57 mg/dL 9.72  Sodium 820 - 601 mmol/L 138  Potassium 3.5 - 5.1 mmol/L 4.1  Chloride 98 - 111 mmol/L 105  CO2 22 - 32 mmol/L 21(L)  Calcium 8.9 - 10.3 mg/dL 9.1  Total Protein 6.5 - 8.1 g/dL 7.1  Total Bilirubin 0.3 - 1.2 mg/dL 1.0  Alkaline Phos 38 - 126 U/L 55  AST 15 - 41 U/L 18  ALT 0 - 44 U/L 12    Discharge instruction: per After Visit Summary and "Baby and Me Booklet".  After visit meds:  Allergies as of 04/03/2019      Reactions   Sulfacetamide Sodium Other (See Comments)   Per Patient caused damage to her eye to the point she has to get new prescription lenses.  Her eye doctor wanted it noted she can not take these any more.      Medication List    STOP taking these medications   calcium carbonate 500 MG chewable tablet Commonly known as:  TUMS - dosed in mg elemental calcium   ferrous sulfate 325 (65 FE) MG EC tablet     TAKE these medications   ibuprofen 600 MG tablet Commonly known as:  ADVIL,MOTRIN Take 1 tablet (600 mg total) by mouth every 6 (six) hours.   loratadine 10 MG tablet Commonly known as:  CLARITIN Take 10 mg by mouth daily.   norethindrone 0.35 MG tablet Commonly known as:  MICRONOR,CAMILA,ERRIN Take 1 tablet (0.35 mg total) by mouth daily. Start 6 weeks postpartum   PRENATAL VITAMIN PO Take 1 tablet by mouth daily.   VITAMIN D PO Take 1 tablet by mouth daily.       Diet: 4 weeks  Activity: Advance as tolerated. Pelvic rest for 6 weeks.   Outpatient follow up:4 weeks Follow up Appt:No future appointments. Follow up Visit: Follow-up Information    [facility] Follow up in 4  week(s).   Specialty:  Obstetrics and Gynecology Contact information: [address] [place] [address], [address] [place] [address] [phone]           Please schedule this patient for Postpartum visit in: 6 weeks with the following provider: Any provider For C/S patients schedule nurse incision check in weeks 2 weeks: no Low risk pregnancy complicated by: None Delivery mode:  SVD Anticipated Birth Control:  OCPs or PoPs (undecided) PP Procedures needed: None  Schedule Integrated BH visit: no      Newborn Data: Live born female  Birth Weight:   APGAR: 1, 6  Newborn Delivery   Birth date/time:  04/01/2019 15:39:00 Delivery type:       Baby Feeding: Breast Disposition:home with mother   04/03/2019 Scheryl Darter, MD

## 2019-04-01 NOTE — Anesthesia Postprocedure Evaluation (Signed)
Anesthesia Post Note  Patient: Olivia Gardner  Procedure(s) Performed: AN AD HOC LABOR EPIDURAL     Patient location during evaluation: Mother Baby Anesthesia Type: Epidural Level of consciousness: awake and oriented Pain management: pain level controlled Vital Signs Assessment: post-procedure vital signs reviewed and stable Respiratory status: spontaneous breathing and respiratory function stable Cardiovascular status: blood pressure returned to baseline Postop Assessment: no headache Anesthetic complications: no    Last Vitals:  Vitals:   04/01/19 1746 04/01/19 1845  BP: 117/66 129/85  Pulse: 86 86  Resp: 18 18  Temp:  36.8 C  SpO2:      Last Pain:  Vitals:   04/01/19 1845  TempSrc: Oral  PainSc: 4    Pain Goal: Patients Stated Pain Goal: 8 (04/01/19 0731)                 Gladys Damme

## 2019-04-02 NOTE — Lactation Note (Signed)
This note was copied from a baby's chart. Lactation Consultation Note  Patient Name: Olivia Gardner [id number] Date: 04/02/2019  Mom reports nipples are very sore. Mom Has baby latched on arrival on left breast in football hold. Mom has him a litlle squished but mouth open wide/lips flanged/checks round.  Mom reports its pinching, but her nipples are so sore she cant tell if they are just sore or he is pinching.  Urged mom to break the suction and take him off.  Mom broke the suction and unlatched him and nipple was compressed. Put parralel pillows behind mom so he wasn't squished and got mom to try again.  Watched mom with positioning and latch.  Mom reports still pinching.  Got her to break suction and take him off and nipple still compressed. Mom reports nipples are too sore right this minute to put him on.  Got mom pumping with DEBP and assisted with hand expression.  Got 3 ml with hand expression.  Mom reports it is painful to hand express. Mom pumped for 15 minutes.  Reported pumping was tolerable but still didn't fell well. Offered to assist with feeding/baby rooting and cuing at this time. Mom denied assistance with feeding.  Urged parents to go ahead and feed the few drops of colostrum mom got with pumping. Urged mom to call lactation as needed. Feeding Type: Breast Fed  LATCH Score Latch: (Enc to call out for latch)                 Interventions    Lactation Tools Discussed/Used     Consult Status      Neomia Dear 04/02/2019, 6:59 PM

## 2019-04-02 NOTE — Progress Notes (Signed)
Post Partum Day 1 Subjective: no complaints, up ad lib, voiding and tolerating PO  Objective: Blood pressure 109/78, pulse 70, temperature 98.1 F (36.7 C), temperature source Oral, resp. rate 16, height 5' 0.5" (1.537 m), weight 72.6 kg, last menstrual period 06/18/2018, SpO2 99 %, unknown if currently breastfeeding.  Physical Exam:  General: alert, cooperative and no distress Lochia: appropriate Uterine Fundus: firm DVT Evaluation: No evidence of DVT seen on physical exam. Negative Homan's sign. No cords or calf tenderness. No significant calf/ankle edema.  Recent Labs    03/31/19 0927  HGB 11.8*  HCT 37.1    Assessment/Plan: Plan for discharge tomorrow and Breastfeeding   LOS: 2 days   Olivia Gardner 04/02/2019, 9:51 AM

## 2019-04-02 NOTE — Lactation Note (Signed)
This note was copied from a baby's chart. Lactation Consultation Note  Patient Name: [name] [id number] Date: 04/02/2019 Reason for consult: Initial assessment;1st time breastfeeding;Term P1, 12 hour female infant. Parent took childbirth classes at [facility]. Infant had one void and one stool (meconium) since delivery. Mom has DEBP at home. Mom latched infant on left breast using football hold, infant nose to mouth, few swallows observed.  Infant sustained latch for 7 mins.  Mom had previously feed infant less than one hour prior to [facility] entering the room. Mom taught back hand expression and infant was given 5 ml of colostrum by spoon.  Mom knows to breastfeed according hunger cues, 8 or more times within 24 hours. Mom knows to ask Nurse or LC if she has any questions, concerns or need assistance with latching infant to breast. LC discussed I & O. Reviewed Baby & Me book's Breastfeeding Basics.  Mom made aware of O/P services, breastfeeding support groups, community resources, and our phone # for post-discharge questions.  Maternal Data Formula Feeding for Exclusion: No Has patient been taught Hand Expression?: Yes(Infant was given 5 ml of colostrum by spoon. ) Does the patient have breastfeeding experience prior to this delivery?: No  Feeding Feeding Type: Breast Fed  LATCH Score Latch: Grasps breast easily, tongue down, lips flanged, rhythmical sucking.  Audible Swallowing: A few with stimulation  Type of Nipple: Everted at rest and after stimulation  Comfort (Breast/Nipple): Soft / non-tender  Hold (Positioning): Assistance needed to correctly position infant at breast and maintain latch.  LATCH Score: 8  Interventions Interventions: Breast feeding basics reviewed;Assisted with latch;Breast massage;Hand express;Breast compression;Adjust position;Support pillows;Position options;Expressed milk  Lactation Tools Discussed/Used     Consult Status Consult Status:  Follow-up Date: 04/03/19 Follow-up type: In-patient    Danelle Earthly 04/02/2019, 4:21 AM

## 2019-04-03 MED ORDER — IBUPROFEN 600 MG PO TABS: 600.0000 mg | ORAL_TABLET | Freq: Four times a day (QID) | ORAL | 0 refills | Status: DC

## 2019-04-03 MED ORDER — NORETHINDRONE 0.35 MG PO TABS: 1.0000 | ORAL_TABLET | Freq: Every day | ORAL | 11 refills | Status: DC

## 2019-04-03 NOTE — Discharge Instructions (Signed)
Vaginal Delivery, Care After °Refer to this sheet in the next few weeks. These instructions provide you with information about caring for yourself after vaginal delivery. Your health care provider may also give you more specific instructions. Your treatment has been planned according to current medical practices, but problems sometimes occur. Call your health care provider if you have any problems or questions. °What can I expect after the procedure? °After vaginal delivery, it is common to have: °· Some bleeding from your vagina. °· Soreness in your abdomen, your vagina, and the area of skin between your vaginal opening and your anus (perineum). °· Pelvic cramps. °· Fatigue. °Follow these instructions at home: °Medicines °· Take over-the-counter and prescription medicines only as told by your health care provider. °· If you were prescribed an antibiotic medicine, take it as told by your health care provider. Do not stop taking the antibiotic until it is finished. °Driving ° °· Do not drive or operate heavy machinery while taking prescription pain medicine. °· Do not drive for 24 hours if you received a sedative. °Lifestyle °· Do not drink alcohol. This is especially important if you are breastfeeding or taking medicine to relieve pain. °· Do not use tobacco products, including cigarettes, chewing tobacco, or e-cigarettes. If you need help quitting, ask your health care provider. °Eating and drinking °· Drink at least 8 eight-ounce glasses of water every day unless you are told not to by your health care provider. If you choose to breastfeed your baby, you may need to drink more water than this. °· Eat high-fiber foods every day. These foods may help prevent or relieve constipation. High-fiber foods include: °? Whole grain cereals and breads. °? Brown rice. °? Beans. °? Fresh fruits and vegetables. °Activity °· Return to your normal activities as told by your health care provider. Ask your health care provider what  activities are safe for you. °· Rest as much as possible. Try to rest or take a nap when your baby is sleeping. °· Do not lift anything that is heavier than your baby or 10 lb (4.5 kg) until your health care provider says that it is safe. °· Talk with your health care provider about when you can engage in sexual activity. This may depend on your: °? Risk of infection. °? Rate of healing. °? Comfort and desire to engage in sexual activity. °Vaginal Care °· If you have an episiotomy or a vaginal tear, check the area every day for signs of infection. Check for: °? More redness, swelling, or pain. °? More fluid or blood. °? Warmth. °? Pus or a bad smell. °· Do not use tampons or douches until your health care provider says this is safe. °· Watch for any blood clots that may pass from your vagina. These may look like clumps of dark red, brown, or black discharge. °General instructions °· Keep your perineum clean and dry as told by your health care provider. °· Wear loose, comfortable clothing. °· Wipe from front to back when you use the toilet. °· Ask your health care provider if you can shower or take a bath. If you had an episiotomy or a perineal tear during labor and delivery, your health care provider may tell you not to take baths for a certain length of time. °· Wear a bra that supports your breasts and fits you well. °· If possible, have someone help you with household activities and help care for your baby for at least a few days after you   leave the hospital. °· Keep all follow-up visits for you and your baby as told by your health care provider. This is important. °Contact a health care provider if: °· You have: °? Vaginal discharge that has a bad smell. °? Difficulty urinating. °? Pain when urinating. °? A sudden increase or decrease in the frequency of your bowel movements. °? More redness, swelling, or pain around your episiotomy or vaginal tear. °? More fluid or blood coming from your episiotomy or vaginal  tear. °? Pus or a bad smell coming from your episiotomy or vaginal tear. °? A fever. °? A rash. °? Little or no interest in activities you used to enjoy. °? Questions about caring for yourself or your baby. °· Your episiotomy or vaginal tear feels warm to the touch. °· Your episiotomy or vaginal tear is separating or does not appear to be healing. °· Your breasts are painful, hard, or turn red. °· You feel unusually sad or worried. °· You feel nauseous or you vomit. °· You pass large blood clots from your vagina. If you pass a blood clot from your vagina, save it to show to your health care provider. Do not flush blood clots down the toilet without having your health care provider look at them. °· You urinate more than usual. °· You are dizzy or light-headed. °· You have not breastfed at all and you have not had a menstrual period for 12 weeks after delivery. °· You have stopped breastfeeding and you have not had a menstrual period for 12 weeks after you stopped breastfeeding. °Get help right away if: °· You have: °? Pain that does not go away or does not get better with medicine. °? Chest pain. °? Difficulty breathing. °? Blurred vision or spots in your vision. °? Thoughts about hurting yourself or your baby. °· You develop pain in your abdomen or in one of your legs. °· You develop a severe headache. °· You faint. °· You bleed from your vagina so much that you fill two sanitary pads in one hour. °This information is not intended to replace advice given to you by your health care provider. Make sure you discuss any questions you have with your health care provider. °Document Released: 12/09/2000 Document Revised: 05/25/2016 Document Reviewed: 12/27/2015 °Elsevier Interactive Patient Education © 2019 [employer] Inc[employer] ° °

## 2019-04-03 NOTE — Lactation Note (Signed)
This note was copied from a baby's chart. Lactation Consultation Note  Patient Name: Boy Brigitte Hopp [id number] Date: 04/03/2019   P1, 36 hour female infant. Per mom, she is extremely tired infant has been cluster feeding and breast are sore. Mom has been using coconut oil and comfort gels but not together.  Mom breast are sensitive and she doesn't want  to hand express or pump at this time. Mom want to supplement with formula for  this feeding to have rest. Parents understand LEAD risk with formula. Mom  still plans to continue to breastfeeding according to hunger cues Infant taken to [facility] for two hours for parents to rest and Nurse supplement infant with Similac Advance with iron according age/ hours of life.    Maternal Data    Feeding    LATCH Score                   Interventions    Lactation Tools Discussed/Used     Consult Status      Danelle Earthly 04/03/2019, 4:38 AM

## 2019-04-03 NOTE — Lactation Note (Addendum)
This note was copied from a baby's chart. Lactation Consultation Note  Patient Name: Olivia Gardner ZOXWR'UToday's Date: 04/03/2019 Reason for consult: Follow-up assessment;Primapara;1st time breastfeeding;Difficult latch;Nipple pain/trauma  Visited with P1 Mom of term baby at 641 hrs old. Baby at 6% weight loss.   Baby went to nursery to give Mom rest, as baby was fussy and not satisfied when breastfeeding.  Mom complains of significant soreness to both nipples.  Nipples abraded on tips.    Baby returned from nursery ready to eat.  He was fed 3 bottles of  Formula 5, 10, 10 ml respectively.    Sitting in upright chair, with pillow at her back vertically, Mom assisted with latching baby in football hold on right breast.  Baby opens his mouth, and Mom wincing with pain.  Showed FOB how to tug on chin to pull jaw further open and uncurl lower lip.  Mom stated the discomfort improved somewhat.  Observed suck pattern.  Unable to identify any swallows at the breast.  Pediatrician came in after about 10 mins.  Took baby off the breast, as he was not in a good suck pattern.  Nipple significantly pinched. On oral assessment, tongue not lifting in mouth when baby cries, and it remains flattish.  On suck assessment, baby not able to create a seal on finger, tongue popping off finger.    Initiated a 24 mm nipple shield, showing Mom how to properly apply.  Nipple pulls well into shield.  Baby latch onto breast, with a wide latch.  Mom needing guidance on how to ensure baby is deeply latched.  No swallowing identified.  Initiated a 5 fr feeding tube with formula.  Baby became more nutritive with deeper jaw extensions and swallows heard.  Mom feels much more comfortable with baby latched on the nipple shield.  FOB reminded on how to tug on chin to open his mouth wider, and Mom to focus on baby's chin being closer to breast.   Talked to parents about need for OP lactation follow-up.  Talked about services in the  area that would assess baby's oral anatomy and determine best treatment, if needed.  Mom seems very relieved that LC could identify this, and now she has a plan.  LC wrote plan on back of CDC guidelines for cleaning of pump parts.  Plan- 1-Keep baby skin to skin as much as possible  2- Feed baby with cues (goal of >8 times per 24 hrs)   A- breast, using nipple shield and feeding tube to supplement per volume guidelines provided   B-Finger feed using feeding tube and syringe per volume guidelines   C- Pace bottle feed baby EBM+/formula per volume guidelines 3- Pump both breasts, using breast massage and hand expression, for 15-20 mins  4- Follow-up with OP lactation  5- call prn for concerns.  Request made to OP lactation  Mom has a Spectra pump at home. Maternal Data    Feeding Feeding Type: Breast Fed Nipple Type: Slow - flow  LATCH Score Latch: Repeated attempts needed to sustain latch, nipple held in mouth throughout feeding, stimulation needed to elicit sucking reflex.  Audible Swallowing: Spontaneous and intermittent(with supplement)  Type of Nipple: Everted at rest and after stimulation  Comfort (Breast/Nipple): Filling, red/small blisters or bruises, mild/mod discomfort  Hold (Positioning): Assistance needed to correctly position infant at breast and maintain latch.  LATCH Score: 7  Interventions Interventions: Breast feeding basics reviewed;Assisted with latch;Skin to skin;Breast massage;Hand express;Breast compression;Adjust position;Support pillows;Comfort gels;Shells;Coconut  oil;Expressed milk;DEBP;Position options  Lactation Tools Discussed/Used Tools: Pump;Nipple Shields;75F feeding tube / Syringe Nipple shield size: 24 Breast pump type: Double-Electric Breast Pump   Consult Status Consult Status: Follow-up Date: 04/08/19 Follow-up type: Out-patient    Olivia Gardner 04/03/2019, 10:10 AM

## 2019-04-05 ENCOUNTER — Inpatient Hospital Stay (HOSPITAL_COMMUNITY): Payer: BLUE CROSS/BLUE SHIELD

## 2019-04-11 ENCOUNTER — Telehealth: Payer: Self-pay | Admitting: *Deleted

## 2019-04-11 ENCOUNTER — Ambulatory Visit: Payer: Self-pay

## 2019-04-11 DIAGNOSIS — R633 Feeding difficulties: Secondary | ICD-10-CM | POA: Diagnosis not present

## 2019-04-11 NOTE — Telephone Encounter (Signed)
Left patient a message to call the office to schedule her 6 week Postpartum appointment.

## 2019-04-11 NOTE — Telephone Encounter (Signed)
-----   Message from Raelyn Mora, [place] sent at 04/01/2019  4:52 PM EDT ----- Regarding: PP Msg Please schedule this patient for Postpartum visit in: 6 weeks with the following provider: Any provider For C/S patients schedule nurse incision check in weeks 2 weeks: no Low risk pregnancy complicated by: none Delivery mode:  SVD Anticipated Birth Control:  POPs PP Procedures needed: none  Schedule Integrated BH visit: no

## 2019-04-11 NOTE — Lactation Note (Signed)
This note was copied from a baby's chart. Lactation Consultation Note  Patient Name: Olivia Gardner Today's Date: 04/11/2019   04/11/2019  Name: Olivia Gardner MRN: [id number] Date of Birth: 04/01/2021 Gestational Age: Gestational Age: [redacted]w[redacted]d Birth Weight: 118.9 oz Weight today:    Today's Weight 7 pounds 11.7 ounces (3506 grams) with clean newborn diaper   10 day old term infant presents today with mom and dad for feeding assessment. Infant is BF at the breast with a NS and is being supplemented with a bottle of formula. Infant using the Avent bottle for feeding. Parents reports they are pacing infant on the bottle and it takes him about 10 minutes to take 2 ounces.   Mom has tried to latch infant without the nipple shield and nipple is very painful and pinched after feeding, her left nipple is worse than the right.   Infant has gained 336 grams in the last 8 days with an average daily weight gain of 42 grams a day.   Infant self awakens about every 2-4 hours to feed. Mom usually feeds infant on both breasts.   Mom reports infant is sleepy on the breast and feedings can take up to an hour. Mom is using awakening techniques as needed.  Parents have noted brown stools and can be very foul smelling. Infant has had some gas.   Mom reports at night, mom is sleeping and dad is giving a bottle of formula. She is sleeping 3-5 hours between pumping.   Mom is pumping about every 2-5 hours for 30 minutes to 1 hour and is getting about an ounce per pumping. Mom is concerned she is not getting enough milk to feed infant. Mom is drinking water and Mother's Milk Tea. Mom feels she is eating well. Enc mom to try hands free bra and hands on pumping.   Mom supply is not adequate for infant at this time. Suspect it may be due to inadequate emptying of the breast in recent days. Mom given information on [employer].   Infant fed at the breast, infant sleepy at the breast and transferred an  inadequate amount of milk Advised parents to continue supplementing post every feeding until milk supply increases.   Infant to follow up with Dr. Barney Drain on Monday. Infant to follow up with Lactation in 1 week. Mom and dad to call with questions/concerns as needed.   Praised family for their efforts in feeding their infant. Praised them for how well their infant is growing.      General Information: Mother's reason for visit: Feeding assessment, low milk supply Consult: Initial Lactation consultant: Noralee Stain RN,IBCLC Breastfeeding experience: BF for long periods, supplementing after each feeding  Maternal medical conditions: Other(+ breast growth with pregnancy) Maternal medications: Other, Pre-natal vitamin(Claritin, Vitamin D)  Breastfeeding History: Frequency of breast feeding: every 2-4 hours Duration of feeding: 1 hour  Supplementation: Supplement method: bottle(Avent, Level 1 nipple) Brand: Similac Formula volume: 2-2.5 ounces Formula frequency: every 2-4 hours   Breast milk volume: 1 ounce  Breast milk frequency: 6+ times a day   Pump type: Spectra Pump frequency: 6-7 x a day Pump volume: 1 ounce with an occasional 2 ounces  Infant Output Assessment: Voids per 24 hours: 6+ Urine color: Clear yellow Stools per 24 hours: 3-4 Stool color: Brown  Breast Assessment: Breast: Soft, Compressible Nipple: Erect Pain level: 5(5-6 without the NS, 2-3 with NS) Pain interventions: Bra, Breast pump, Nipple shield, Coconut oil  Feeding Assessment: Infant oral assessment:  Variance Infant oral assessment comment: Infant with thick labial frenulum that inserts at the bottom of the gum ridge with slight divot to center of the gum ridge. Upper lip with blanching with flanging, upper lip needs flanging on the breast.  Infant with posterior lingual frenulum noted. Tongue with good extension and lateralization. infant with some decreased mid tongue elevation. Infant with hump to  back of tongue with suckling, infant with strong suckle and good tongue extension. Parents given website information and local provider information. Parents to research and see if they want to have infant evaluated.  Positioning: Football(right breast, 20 minutes) Latch: 1 - Repeated attempts needed to sustain latch, nipple held in mouth throughout feeding, stimulation needed to elicit sucking reflex. Audible swallowing: 2 - Spontaneous and intermittent Type of nipple: 2 - Everted at rest and after stimulation Comfort: 1 - Filling, red/small blisters or bruises, mild/mod discomfort Hold: 2 - No assistance needed to correctly position infant at breast LATCH score: 8 Latch assessment: Deep Lips flanged: No(upper lip needs flanging) Suck assessment: Displays both Tools: Nipple shield 24 mm Pre-feed weight: 3506 grams Post feed weight: 3526 grams Amount transferred: 20 ml Amount supplemented: 0  Additional Feeding Assessment: Infant oral assessment: Variance Infant oral assessment comment: see note Positioning: Football(left breast, 10 minutes) Latch: 1 - Repeated attempts neede to sustain latch, nipple held in mouth throughout feeding, stimulation needed to elicit sucking reflex. Audible swallowing: 1 - A few with stimulation Type of nipple: 2 - Everted at rest and after stimulation Comfort: 1 - Filling, red/small blisters or bruises, mild/mod discomfort Hold: 2 - No assistance needed to correctly position infant at breast LATCH score: 7 Latch assessment: Deep Lips flanged: No(upper lip needs flanging) Suck assessment: Displays both Tools: Nipple shield 24 mm Pre-feed weight: 3526 grams Post feed weight: 3532 grams Amount transferred: 6 ml Amount supplemented: 30 ml formula via bottle  Totals: Total amount transferred: 26 ml Total supplement given: 30 ml via bottle Total amount pumped post feed: 20 ml   Plan:  1. Offer infant the breast with feeding cues  Limit the feedings to  20 minutes if infant is sleepy at the breast 2. Keep awake at the breast as needed, feed infant skin to skin with feeding 3. Massage/compress breast with feedings when infant slowing down 4. Use the # 24 Nipple shield with feeding as needed 5. Infant needs about 65-88 ml (2-3 ounces) for 8 feedings in 24 hours or 525-700 ml (18-23 ounces) in 24 hours. Infant may take more or less depending on how often he feeds. Feed him until he is satisfied.  6. Feed infant using paced bottle feeding method (video on [email or web address]) 7. Offer infant a bottle of pumped breast milk or formula after breast feeding if he is still cueing to feed 8. Continue to pump about 7-8 x a day for 15-20 minutes to protect milk supply, massage breast with feedings 9. Consider taking Fenugreek or Mother Love More Milk Special Blend to see if that helps with milk production 10. Power pumping may be helpful, pump for 20 minutes, rest for 20 minutes, pump for 10 minutes, rest for 10 minutes, pump for 10 minutes 11. Thank you for allowing me to assist you today 12. Keep up the good work 13. Please call with any questions/concerns as needed [phone] 14. Follow up with Lactation in 1 week   Ed Blalock RN, [employer]  Olivia Gardner 04/11/2019, 10:05 AM

## 2019-05-01 ENCOUNTER — Other Ambulatory Visit: Payer: Self-pay

## 2019-05-01 ENCOUNTER — Other Ambulatory Visit (INDEPENDENT_AMBULATORY_CARE_PROVIDER_SITE_OTHER): Payer: BLUE CROSS/BLUE SHIELD | Admitting: *Deleted

## 2019-05-01 VITALS — BP 119/75 | HR 82 | Temp 98.4°F | Resp 16 | Ht 64.0 in | Wt 136.0 lb

## 2019-05-01 DIAGNOSIS — N898 Other specified noninflammatory disorders of vagina: Secondary | ICD-10-CM

## 2019-05-01 NOTE — Progress Notes (Signed)
Pt here for a nurse visit.  She is 4 weeks post delivery and is complaining of vaginal itching,burning and vaginal odor.  Upon exam it is noted that she has an abrasive area on the left inner butt cheek right beneath the incision from her perineal tear.  The skin was raw and sticking to the other side.  I cleaned the area and recommended vaseline to area to keep down friction.  I also did a Aptima swab and urinalysis.Urine was very clear without leukocytes or Nitrites.  Pt has a PP visit in 2 weeks which will be in office so that the area can be rechecked.  Instructed pt to call if area becomes worse or any further issue arise.

## 2019-05-02 LAB — CERVICOVAGINAL ANCILLARY ONLY
Bacterial vaginitis: NEGATIVE
Candida vaginitis: NEGATIVE

## 2019-05-14 ENCOUNTER — Other Ambulatory Visit: Payer: Self-pay

## 2019-05-14 ENCOUNTER — Ambulatory Visit (INDEPENDENT_AMBULATORY_CARE_PROVIDER_SITE_OTHER): Payer: BLUE CROSS/BLUE SHIELD | Admitting: Advanced Practice Midwife

## 2019-05-14 ENCOUNTER — Encounter: Payer: Self-pay | Admitting: Advanced Practice Midwife

## 2019-05-14 DIAGNOSIS — Z3009 Encounter for other general counseling and advice on contraception: Secondary | ICD-10-CM

## 2019-05-14 MED ORDER — NORETHIN-ETH ESTRAD-FE BIPHAS 1 MG-10 MCG / 10 MCG PO TABS: 1.0000 | ORAL_TABLET | Freq: Every day | ORAL | 11 refills | Status: DC

## 2019-05-14 NOTE — Progress Notes (Signed)
Post Partum Exam  Olivia Gardner is a 28 y.o. G4P1001 female who presents for a postpartum visit. She is 6 weeks postpartum following a IOL @ 41 weeks.. I have fully reviewed the prenatal and intrapartum course. The delivery was at 41 gestational weeks with a 2nd degree perineal laceration.  Anesthesia: epidural. Postpartum course has been unremarkable. Baby's course has been unremarkable Baby is feeding by formula with Similac Bleeding Pt has started her cycle on 5/15. Bowel function is normal with magnesium Bladder function is normal. Patient is not sexually active. Contraception method is oral progesterone-only contraceptive. Postpartum depression screening:neg  The following portions of the patient's history were reviewed and updated as appropriate: allergies, current medications, past family history, past medical history, past social history, past surgical history and problem list. Last pap smear done 04/01/19 and was Normal  Review of Systems Pertinent items noted in HPI and remainder of comprehensive ROS otherwise negative.    Objective:  currently breastfeeding.  VS reviewed, nursing note reviewed,  Constitutional: well developed, well nourished, no distress HEENT: normocephalic CV: normal rate Pulm/chest wall: normal effort Breast Exam:  deferred Abdomen: soft Neuro: alert and oriented x 3 Skin: warm, dry Psych: affect normal Pelvic exam: On visual inspection, laceration well approximated, no visible suture, no edema or erythema. Some granulation tissue to left side of perineum, and onto left labia.      Assessment/Plan:   1. Postpartum examination following vaginal delivery --Doing well, bonding well with infant, good support at home. --Was breastfeeding, difficulty with supply, stopped breastfeeding yesterday. She feels Ok about it. Baby is growing and eating well.  2. Encounter for counseling regarding contraception --Discussed LARCs as most effective forms of birth  control.  Discussed benefits/risks of other methods.  Pt desires OCPs.  Currently not breastfeeding so would prefer OCPs to POPs.  Discussed failure rates of OCPs with regular use. Reviewed risk factors and s/sx of DVT/PE/reasons to seek medical care.   --Pt has regular menses, moderate amount usually.  Will start with lo dose pill.  Discussed increasing estrogen if breakthrough spotting. Rx for lo loestrin fe sent to pharmacy.  - Norethindrone-Ethinyl Estradiol-Fe Biphas (LO LOESTRIN FE) 1 MG-10 MCG / 10 MCG tablet; Take 1 tablet by mouth daily.  Dispense: 1 Package; Refill: 11    Social/emotional concerns:  none.  Contraception: OCP (estrogen/progesterone)  Follow up in: 1 year or as needed.   Sharen Counter, CNM 3:36 PM

## 2019-06-25 ENCOUNTER — Ambulatory Visit (INDEPENDENT_AMBULATORY_CARE_PROVIDER_SITE_OTHER): Payer: BLUE CROSS/BLUE SHIELD | Admitting: Physician Assistant

## 2019-06-25 ENCOUNTER — Encounter: Payer: Self-pay | Admitting: Physician Assistant

## 2019-06-25 VITALS — BP 111/69 | HR 72 | Temp 97.6°F | Ht 60.0 in | Wt 135.0 lb

## 2019-06-25 DIAGNOSIS — Z0000 Encounter for general adult medical examination without abnormal findings: Secondary | ICD-10-CM | POA: Diagnosis not present

## 2019-06-25 DIAGNOSIS — Z131 Encounter for screening for diabetes mellitus: Secondary | ICD-10-CM

## 2019-06-25 DIAGNOSIS — Z13220 Encounter for screening for lipoid disorders: Secondary | ICD-10-CM | POA: Diagnosis not present

## 2019-06-25 DIAGNOSIS — Z20822 Contact with and (suspected) exposure to covid-19: Secondary | ICD-10-CM

## 2019-06-25 DIAGNOSIS — R5383 Other fatigue: Secondary | ICD-10-CM

## 2019-06-25 DIAGNOSIS — R635 Abnormal weight gain: Secondary | ICD-10-CM

## 2019-06-25 DIAGNOSIS — R6889 Other general symptoms and signs: Secondary | ICD-10-CM

## 2019-06-25 DIAGNOSIS — E559 Vitamin D deficiency, unspecified: Secondary | ICD-10-CM | POA: Diagnosis not present

## 2019-06-25 DIAGNOSIS — L659 Nonscarring hair loss, unspecified: Secondary | ICD-10-CM | POA: Diagnosis not present

## 2019-06-25 DIAGNOSIS — O905 Postpartum thyroiditis: Secondary | ICD-10-CM

## 2019-06-25 HISTORY — DX: Abnormal weight gain: R63.5

## 2019-06-25 NOTE — Patient Instructions (Signed)
Health Maintenance, Female Adopting a healthy lifestyle and getting preventive care are important in promoting health and wellness. Ask your health care provider about:  The right schedule for you to have regular tests and exams.  Things you can do on your own to prevent diseases and keep yourself healthy. What should I know about diet, weight, and exercise? Eat a healthy diet   Eat a diet that includes plenty of vegetables, fruits, low-fat dairy products, and lean protein.  Do not eat a lot of foods that are high in solid fats, added sugars, or sodium. Maintain a healthy weight Body mass index (BMI) is used to identify weight problems. It estimates body fat based on height and weight. Your health care provider can help determine your BMI and help you achieve or maintain a healthy weight. Get regular exercise Get regular exercise. This is one of the most important things you can do for your health. Most adults should:  Exercise for at least 150 minutes each week. The exercise should increase your heart rate and make you sweat (moderate-intensity exercise).  Do strengthening exercises at least twice a week. This is in addition to the moderate-intensity exercise.  Spend less time sitting. Even light physical activity can be beneficial. Watch cholesterol and blood lipids Have your blood tested for lipids and cholesterol at 30 years of age, then have this test every 5 years. Have your cholesterol levels checked more often if:  Your lipid or cholesterol levels are high.  You are older than 30 years of age.  You are at high risk for heart disease. What should I know about cancer screening? Depending on your health history and family history, you may need to have cancer screening at various ages. This may include screening for:  Breast cancer.  Cervical cancer.  Colorectal cancer.  Skin cancer.  Lung cancer. What should I know about heart disease, diabetes, and high blood  pressure? Blood pressure and heart disease  High blood pressure causes heart disease and increases the risk of stroke. This is more likely to develop in people who have high blood pressure readings, are of African descent, or are overweight.  Have your blood pressure checked: ? Every 3-5 years if you are 18-39 years of age. ? Every year if you are 40 years old or older. Diabetes Have regular diabetes screenings. This checks your fasting blood sugar level. Have the screening done:  Once every three years after age 40 if you are at a normal weight and have a low risk for diabetes.  More often and at a younger age if you are overweight or have a high risk for diabetes. What should I know about preventing infection? Hepatitis B If you have a higher risk for hepatitis B, you should be screened for this virus. Talk with your health care provider to find out if you are at risk for hepatitis B infection. Hepatitis C Testing is recommended for:  Everyone born from 1945 through 1965.  Anyone with known risk factors for hepatitis C. Sexually transmitted infections (STIs)  Get screened for STIs, including gonorrhea and chlamydia, if: ? You are sexually active and are younger than 30 years of age. ? You are older than 30 years of age and your health care provider tells you that you are at risk for this type of infection. ? Your sexual activity has changed since you were last screened, and you are at increased risk for chlamydia or gonorrhea. Ask your health care provider if   you are at risk.  Ask your health care provider about whether you are at high risk for HIV. Your health care provider may recommend a prescription medicine to help prevent HIV infection. If you choose to take medicine to prevent HIV, you should first get tested for HIV. You should then be tested every 3 months for as long as you are taking the medicine. Pregnancy  If you are about to stop having your period (premenopausal) and  you may become pregnant, seek counseling before you get pregnant.  Take 400 to 800 micrograms (mcg) of folic acid every day if you become pregnant.  Ask for birth control (contraception) if you want to prevent pregnancy. Osteoporosis and menopause Osteoporosis is a disease in which the bones lose minerals and strength with aging. This can result in bone fractures. If you are 65 years old or older, or if you are at risk for osteoporosis and fractures, ask your health care provider if you should:  Be screened for bone loss.  Take a calcium or vitamin D supplement to lower your risk of fractures.  Be given hormone replacement therapy (HRT) to treat symptoms of menopause. Follow these instructions at home: Lifestyle  Do not use any products that contain nicotine or tobacco, such as cigarettes, e-cigarettes, and chewing tobacco. If you need help quitting, ask your health care provider.  Do not use street drugs.  Do not share needles.  Ask your health care provider for help if you need support or information about quitting drugs. Alcohol use  Do not drink alcohol if: ? Your health care provider tells you not to drink. ? You are pregnant, may be pregnant, or are planning to become pregnant.  If you drink alcohol: ? Limit how much you use to 0-1 drink a day. ? Limit intake if you are breastfeeding.  Be aware of how much alcohol is in your drink. In the [place] one drink equals one 12 oz bottle of beer (355 mL), one 5 oz glass of wine (148 mL), or one 1 oz glass of hard liquor (44 mL). General instructions  Schedule regular health, dental, and eye exams.  Stay current with your vaccines.  Tell your health care provider if: ? You often feel depressed. ? You have ever been abused or do not feel safe at home. Summary  Adopting a healthy lifestyle and getting preventive care are important in promoting health and wellness.  Follow your health care provider's instructions about healthy  diet, exercising, and getting tested or screened for diseases.  Follow your health care provider's instructions on monitoring your cholesterol and blood pressure. This information is not intended to replace advice given to you by your health care provider. Make sure you discuss any questions you have with your health care provider. Document Released: 06/27/2011 Document Revised: 12/05/2018 Document Reviewed: 12/05/2018 Elsevier Patient Education  2020 [employer] Inc.  

## 2019-06-25 NOTE — Progress Notes (Signed)
Subjective:     Olivia Gardner is a 28 y.o. female and is here for a comprehensive physical exam. The patient reports problems - see below.    Pt has a 33 month old boy at home. She doesn't really know how she is supposed to feel but she does not feel well. She was with a nurse friend on vacation and told her to get some labs. She has pretty low energy. Her hair is falling out by the hand fulls. She is having more headaches. She feels like she cannot lose weight. She feels like her weight is not going anywhere and clustering around her abdomen and back area. She feels like she is holding on to fluid in her legs. She is not taking OCP due to forgetting.   She also would like to be tested for covid antibodies. She had all the covid symptoms in October 2019 for 3 weeks. She was strep and flu negative.    Social History   Socioeconomic History  . Marital status: Single    Spouse name: Not on file  . Number of children: Not on file  . Years of education: Not on file  . Highest education level: Not on file  Occupational History  . Not on file  Social Needs  . Financial resource strain: Not hard at all  . Food insecurity    Worry: Not on file    Inability: Not on file  . Transportation needs    Medical: Not on file    Non-medical: Not on file  Tobacco Use  . Smoking status: Never Smoker  . Smokeless tobacco: Never Used  Substance and Sexual Activity  . Alcohol use: Not Currently    Alcohol/week: 0.0 standard drinks  . Drug use: No  . Sexual activity: Yes    Birth control/protection: Pill  Lifestyle  . Physical activity    Days per week: Not on file    Minutes per session: Not on file  . Stress: Not on file  Relationships  . Social Herbalist on phone: Not on file    Gets together: Not on file    Attends religious service: Not on file    Active member of club or organization: Not on file    Attends meetings of clubs or organizations: Not on file    Relationship  status: Not on file  . Intimate partner violence    Fear of current or ex partner: Not on file    Emotionally abused: Not on file    Physically abused: Not on file    Forced sexual activity: Not on file  Other Topics Concern  . Not on file  Social History Narrative  . Not on file   Health Maintenance  Topic Date Due  . INFLUENZA VACCINE  07/27/2019  . PAP-Cervical Cytology Screening  04/21/2021  . PAP SMEAR-Modifier  04/21/2021  . TETANUS/TDAP  01/01/2029  . HIV Screening  Completed    The following portions of the patient's history were reviewed and updated as appropriate: allergies, current medications, past family history, past medical history, past social history, past surgical history and problem list.  Review of Systems Pertinent items noted in HPI and remainder of comprehensive ROS otherwise negative.   Objective:    BP 111/69   Pulse 72   Temp 97.6 F (36.4 C) (Oral)   Ht 5' (1.524 m)   Wt 135 lb (61.2 kg)   SpO2 98%   BMI 26.37 kg/m  General appearance: alert, cooperative and appears stated age Head: Normocephalic, without obvious abnormality, atraumatic Eyes: conjunctivae/corneas clear. PERRL, EOM's intact. Fundi benign. Ears: normal TM's and external ear canals both ears Nose: Nares normal. Septum midline. Mucosa normal. No drainage or sinus tenderness. Throat: lips, mucosa, and tongue normal; teeth and gums normal Neck: no adenopathy, no carotid bruit, no JVD, supple, symmetrical, trachea midline and thyroid not enlarged, symmetric, no tenderness/mass/nodules Back: symmetric, no curvature. ROM normal. No CVA tenderness. Lungs: clear to auscultation bilaterally Heart: regular rate and rhythm, S1, S2 normal, no murmur, click, rub or gallop Abdomen: soft, non-tender; bowel sounds normal; no masses,  no organomegaly Extremities: extremities normal, atraumatic, no cyanosis or edema Pulses: 2+ and symmetric Skin: Skin color, texture, turgor normal. No rashes or  lesions Lymph nodes: Cervical, supraclavicular, and axillary nodes normal. Neurologic: Alert and oriented X 3, normal strength and tone. Normal symmetric reflexes. Normal coordination and gait    Assessment:    Healthy female exam.      Plan:      Marland Kitchen.Marland Kitchen.Olivia Gardner was seen today for annual exam.  Diagnoses and all orders for this visit:  Routine physical examination -     Lipid Panel w/reflex Direct LDL -     COMPLETE METABOLIC PANEL WITH GFR -     TSH -     B12 and Folate Panel -     VITAMIN D 25 Hydroxy (Vit-D Deficiency, Fractures) -     Cortisol, free, Serum -     CBC with Differential/Platelet -     Ferritin -     SAR CoV2 Serology (COVID 19)AB(IGG)IA  Screening for lipid disorders -     Lipid Panel w/reflex Direct LDL  Screening for diabetes mellitus -     COMPLETE METABOLIC PANEL WITH GFR  Abnormal weight gain -     TSH -     Cortisol, free, Serum  No energy -     TSH -     B12 and Folate Panel -     VITAMIN D 25 Hydroxy (Vit-D Deficiency, Fractures) -     Cortisol, free, Serum -     CBC with Differential/Platelet -     Ferritin  Hair loss -     B12 and Folate Panel -     VITAMIN D 25 Hydroxy (Vit-D Deficiency, Fractures) -     Cortisol, free, Serum -     CBC with Differential/Platelet -     Ferritin  Suspected Covid-19 Virus Infection -     SAR CoV2 Serology (COVID 19)AB(IGG)IA   .Marland Kitchen. [facility] screen [facility]  Decreased Interest 0  Down, Depressed, Hopeless 1  PHQ - 2 Score 1  Altered sleeping 1  Tired, decreased energy 1  Change in appetite 0  Feeling bad or failure about yourself  0  Trouble concentrating 0  Moving slowly or fidgety/restless 0  Suicidal thoughts 0  PHQ-9 Score 3  Difficult doing work/chores Somewhat difficult   .Marland Kitchen. Discussed 150 minutes of exercise a week.  Encouraged vitamin D 1000 units and Calcium 1300mg  or 4 servings of dairy a day.  Pap up to date.  Vaccine up to date.   Labs ordered to evaluate symptoms.  Discussed this could always be a normal variant of recovering from child birth. I do think checking thyroid, hemoglobin, vitamins, cortisol can be reassuring and if something is very low we can fix it.    Up to date pap 2019. See After Visit Summary for  Counseling Recommendations

## 2019-06-26 ENCOUNTER — Encounter: Payer: Self-pay | Admitting: Physician Assistant

## 2019-06-26 DIAGNOSIS — O905 Postpartum thyroiditis: Secondary | ICD-10-CM | POA: Insufficient documentation

## 2019-06-26 NOTE — Progress Notes (Signed)
Cholesterol is elevated. HDL is great. LDL is not at goal. Your overall CV risk is still low so continuing to work on lifestyle/diet/exercise is still the goal.   Kidney, liver, glucose look great.   b12 looks great.   Vitamin D low normal. I would make sure to take 1000 units D3 in addition to MVI.   You are not anemic but your iron stores are really low. Need to be on ferrous sulfate 325mg  once daily with breakfast. Increase iron rich foods.   Your TSH is decreased which is indictative of "Hyper" thyroid. Since this is in the first 6 months after delivering it is most likely postpartum thyroiditis. This is a transient autoimmune attack of the thyroid. Usually levels return to normal within a year. I do want to order some labs to help distinguish postpartum from graves disease(chronic autoimmune thyroid disease). I will get the nurse to add these on. Most patients do not need medication treatment we let it run its course. You do need close monitoring of thyroid levels every 4 weeks. Some patients will actually swing hypo thyroid for a time as well. I know this is a lot. Sometimes we can treat some of your symptoms of thyroid that can help. Hair loss is not one that is easily treated. You could certainly start biotin. I do not mind setting up a telephone call to answer questions.   (please add TSI(thyrotropin) free t4, free t3)   Cortisol/antibodies still pending.

## 2019-06-26 NOTE — Progress Notes (Signed)
Will you call and make sure she doesn't have any questions and let her know I can call her this afternoon to discuss if she has any questions.

## 2019-06-28 NOTE — Progress Notes (Signed)
T3 just barely out of range.   No covid antibodies detected.

## 2019-06-30 NOTE — Progress Notes (Signed)
Cortisol looks perfect.

## 2019-07-01 ENCOUNTER — Other Ambulatory Visit: Payer: Self-pay | Admitting: Neurology

## 2019-07-01 DIAGNOSIS — R7989 Other specified abnormal findings of blood chemistry: Secondary | ICD-10-CM

## 2019-07-01 NOTE — Progress Notes (Signed)
TSH, Free T4, Free T3

## 2019-07-04 LAB — COMPLETE METABOLIC PANEL WITH GFR
AG Ratio: 1.6 (calc) (ref 1.0–2.5)
ALT: 15 U/L (ref 6–29)
AST: 17 U/L (ref 10–30)
Albumin: 4.3 g/dL (ref 3.6–5.1)
Alkaline phosphatase (APISO): 59 U/L (ref 31–125)
BUN: 11 mg/dL (ref 7–25)
CO2: 27 mmol/L (ref 20–32)
Calcium: 9.5 mg/dL (ref 8.6–10.2)
Chloride: 102 mmol/L (ref 98–110)
Creat: 0.71 mg/dL (ref 0.50–1.10)
GFR, Est African American: 133 mL/min/{1.73_m2} (ref >=60)
GFR, Est Non African American: 115 mL/min/{1.73_m2} (ref >=60)
Globulin: 2.7 g/dL (calc) (ref 1.9–3.7)
Glucose, Bld: 87 mg/dL (ref 65–99)
Potassium: 4.5 mmol/L (ref 3.5–5.3)
Sodium: 139 mmol/L (ref 135–146)
Total Bilirubin: 0.7 mg/dL (ref 0.2–1.2)
Total Protein: 7 g/dL (ref 6.1–8.1)

## 2019-07-04 LAB — T3, FREE: T3, Free: 4.3 pg/mL — ABNORMAL HIGH (ref 2.3–4.2)

## 2019-07-04 LAB — T4, FREE: Free T4: 1.6 ng/dL (ref 0.8–1.8)

## 2019-07-04 LAB — LIPID PANEL W/REFLEX DIRECT LDL
Cholesterol: 255 mg/dL — ABNORMAL HIGH (ref <200)
HDL: 64 mg/dL (ref >=50)
LDL Cholesterol (Calc): 166 mg/dL (calc) — ABNORMAL HIGH
Non-HDL Cholesterol (Calc): 191 mg/dL (calc) — ABNORMAL HIGH (ref <130)
Total CHOL/HDL Ratio: 4 (calc) (ref <5.0)
Triglycerides: 121 mg/dL (ref <150)

## 2019-07-04 LAB — CBC WITH DIFFERENTIAL/PLATELET
Absolute Monocytes: 677 cells/uL (ref 200–950)
Basophils Absolute: 31 cells/uL (ref 0–200)
Basophils Relative: 0.5 %
Eosinophils Absolute: 128 cells/uL (ref 15–500)
Eosinophils Relative: 2.1 %
HCT: 38.2 % (ref 35.0–45.0)
Hemoglobin: 12.8 g/dL (ref 11.7–15.5)
Lymphs Abs: 2275 cells/uL (ref 850–3900)
MCH: 29.2 pg (ref 27.0–33.0)
MCHC: 33.5 g/dL (ref 32.0–36.0)
MCV: 87 fL (ref 80.0–100.0)
MPV: 11.4 fL (ref 7.5–12.5)
Monocytes Relative: 11.1 %
Neutro Abs: 2989 cells/uL (ref 1500–7800)
Neutrophils Relative %: 49 %
Platelets: 234 10*3/uL (ref 140–400)
RBC: 4.39 10*6/uL (ref 3.80–5.10)
RDW: 13.1 % (ref 11.0–15.0)
Total Lymphocyte: 37.3 %
WBC: 6.1 10*3/uL (ref 3.8–10.8)

## 2019-07-04 LAB — B12 AND FOLATE PANEL
Folate: 17.5 ng/mL
Vitamin B-12: 578 pg/mL (ref 200–1100)

## 2019-07-04 LAB — FERRITIN: Ferritin: 8 ng/mL — ABNORMAL LOW (ref 16–154)

## 2019-07-04 LAB — THYROID STIMULATING IMMUNOGLOBULIN: TSI: <89 % baseline (ref <140)

## 2019-07-04 LAB — CORTISOL, FREE: Cortisol Free, Ser: 0.58 ug/dL

## 2019-07-04 LAB — VITAMIN D 25 HYDROXY (VIT D DEFICIENCY, FRACTURES): Vit D, 25-Hydroxy: 33 ng/mL (ref 30–100)

## 2019-07-04 LAB — TSH: TSH: 0.21 mIU/L — ABNORMAL LOW

## 2019-07-04 LAB — SAR COV2 SEROLOGY (COVID19)AB(IGG),IA: SARS CoV2 AB IGG: NEGATIVE

## 2019-09-20 ENCOUNTER — Telehealth: Payer: Self-pay

## 2019-09-20 NOTE — Telephone Encounter (Signed)
[facility] billing trailer. B12 and Folate not covered.   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

## 2019-09-24 ENCOUNTER — Encounter: Payer: Self-pay | Admitting: Physician Assistant

## 2019-09-24 DIAGNOSIS — D513 Other dietary vitamin B12 deficiency anemia: Secondary | ICD-10-CM | POA: Insufficient documentation

## 2019-09-24 NOTE — Telephone Encounter (Signed)
Added to billing trailer.  °

## 2019-09-24 NOTE — Telephone Encounter (Signed)
D51.3

## 2020-01-13 ENCOUNTER — Other Ambulatory Visit: Payer: Self-pay | Admitting: Advanced Practice Midwife

## 2020-01-13 MED ORDER — ETONOGESTREL-ETHINYL ESTRADIOL 0.12-0.015 MG/24HR VA RING: VAGINAL_RING | VAGINAL | 12 refills | Status: DC

## 2020-01-14 DIAGNOSIS — Z789 Other specified health status: Secondary | ICD-10-CM

## 2020-01-14 MED ORDER — ETONOGESTREL-ETHINYL ESTRADIOL 0.12-0.015 MG/24HR VA RING: VAGINAL_RING | VAGINAL | 12 refills | Status: DC

## 2020-01-14 NOTE — Telephone Encounter (Signed)
Nuva Ring Rx sent to correct pharmacy per Sharen Counter, CNM.

## 2020-04-29 DIAGNOSIS — M542 Cervicalgia: Secondary | ICD-10-CM | POA: Diagnosis not present

## 2020-04-29 DIAGNOSIS — M9903 Segmental and somatic dysfunction of lumbar region: Secondary | ICD-10-CM | POA: Diagnosis not present

## 2020-04-29 DIAGNOSIS — M9905 Segmental and somatic dysfunction of pelvic region: Secondary | ICD-10-CM | POA: Diagnosis not present

## 2020-04-29 DIAGNOSIS — M545 Low back pain: Secondary | ICD-10-CM | POA: Diagnosis not present

## 2020-05-05 DIAGNOSIS — M9903 Segmental and somatic dysfunction of lumbar region: Secondary | ICD-10-CM | POA: Diagnosis not present

## 2020-05-05 DIAGNOSIS — M9905 Segmental and somatic dysfunction of pelvic region: Secondary | ICD-10-CM | POA: Diagnosis not present

## 2020-05-05 DIAGNOSIS — M545 Low back pain: Secondary | ICD-10-CM | POA: Diagnosis not present

## 2020-05-05 DIAGNOSIS — M542 Cervicalgia: Secondary | ICD-10-CM | POA: Diagnosis not present

## 2020-05-06 DIAGNOSIS — M9903 Segmental and somatic dysfunction of lumbar region: Secondary | ICD-10-CM | POA: Diagnosis not present

## 2020-05-06 DIAGNOSIS — M9905 Segmental and somatic dysfunction of pelvic region: Secondary | ICD-10-CM | POA: Diagnosis not present

## 2020-05-06 DIAGNOSIS — M542 Cervicalgia: Secondary | ICD-10-CM | POA: Diagnosis not present

## 2020-05-06 DIAGNOSIS — M545 Low back pain: Secondary | ICD-10-CM | POA: Diagnosis not present

## 2020-05-07 DIAGNOSIS — M542 Cervicalgia: Secondary | ICD-10-CM | POA: Diagnosis not present

## 2020-05-07 DIAGNOSIS — M9905 Segmental and somatic dysfunction of pelvic region: Secondary | ICD-10-CM | POA: Diagnosis not present

## 2020-05-07 DIAGNOSIS — M9903 Segmental and somatic dysfunction of lumbar region: Secondary | ICD-10-CM | POA: Diagnosis not present

## 2020-05-07 DIAGNOSIS — M545 Low back pain: Secondary | ICD-10-CM | POA: Diagnosis not present

## 2020-05-11 DIAGNOSIS — M542 Cervicalgia: Secondary | ICD-10-CM | POA: Diagnosis not present

## 2020-05-11 DIAGNOSIS — M9903 Segmental and somatic dysfunction of lumbar region: Secondary | ICD-10-CM | POA: Diagnosis not present

## 2020-05-11 DIAGNOSIS — M9905 Segmental and somatic dysfunction of pelvic region: Secondary | ICD-10-CM | POA: Diagnosis not present

## 2020-05-11 DIAGNOSIS — M545 Low back pain: Secondary | ICD-10-CM | POA: Diagnosis not present

## 2020-05-12 DIAGNOSIS — M542 Cervicalgia: Secondary | ICD-10-CM | POA: Diagnosis not present

## 2020-05-12 DIAGNOSIS — M9903 Segmental and somatic dysfunction of lumbar region: Secondary | ICD-10-CM | POA: Diagnosis not present

## 2020-05-12 DIAGNOSIS — M545 Low back pain: Secondary | ICD-10-CM | POA: Diagnosis not present

## 2020-05-12 DIAGNOSIS — M9905 Segmental and somatic dysfunction of pelvic region: Secondary | ICD-10-CM | POA: Diagnosis not present

## 2020-05-14 DIAGNOSIS — M542 Cervicalgia: Secondary | ICD-10-CM | POA: Diagnosis not present

## 2020-05-14 DIAGNOSIS — M545 Low back pain: Secondary | ICD-10-CM | POA: Diagnosis not present

## 2020-05-14 DIAGNOSIS — M9903 Segmental and somatic dysfunction of lumbar region: Secondary | ICD-10-CM | POA: Diagnosis not present

## 2020-05-14 DIAGNOSIS — M9905 Segmental and somatic dysfunction of pelvic region: Secondary | ICD-10-CM | POA: Diagnosis not present

## 2020-05-18 DIAGNOSIS — M542 Cervicalgia: Secondary | ICD-10-CM | POA: Diagnosis not present

## 2020-05-18 DIAGNOSIS — M545 Low back pain: Secondary | ICD-10-CM | POA: Diagnosis not present

## 2020-05-18 DIAGNOSIS — M9903 Segmental and somatic dysfunction of lumbar region: Secondary | ICD-10-CM | POA: Diagnosis not present

## 2020-05-18 DIAGNOSIS — M9905 Segmental and somatic dysfunction of pelvic region: Secondary | ICD-10-CM | POA: Diagnosis not present

## 2020-05-19 ENCOUNTER — Ambulatory Visit: Payer: BLUE CROSS/BLUE SHIELD | Admitting: Certified Nurse Midwife

## 2020-05-19 DIAGNOSIS — M542 Cervicalgia: Secondary | ICD-10-CM | POA: Diagnosis not present

## 2020-05-19 DIAGNOSIS — M9903 Segmental and somatic dysfunction of lumbar region: Secondary | ICD-10-CM | POA: Diagnosis not present

## 2020-05-19 DIAGNOSIS — M545 Low back pain: Secondary | ICD-10-CM | POA: Diagnosis not present

## 2020-05-19 DIAGNOSIS — M9905 Segmental and somatic dysfunction of pelvic region: Secondary | ICD-10-CM | POA: Diagnosis not present

## 2020-05-20 DIAGNOSIS — M545 Low back pain: Secondary | ICD-10-CM | POA: Diagnosis not present

## 2020-05-20 DIAGNOSIS — M542 Cervicalgia: Secondary | ICD-10-CM | POA: Diagnosis not present

## 2020-05-20 DIAGNOSIS — M9905 Segmental and somatic dysfunction of pelvic region: Secondary | ICD-10-CM | POA: Diagnosis not present

## 2020-05-20 DIAGNOSIS — M9903 Segmental and somatic dysfunction of lumbar region: Secondary | ICD-10-CM | POA: Diagnosis not present

## 2020-05-26 DIAGNOSIS — M542 Cervicalgia: Secondary | ICD-10-CM | POA: Diagnosis not present

## 2020-05-26 DIAGNOSIS — M9903 Segmental and somatic dysfunction of lumbar region: Secondary | ICD-10-CM | POA: Diagnosis not present

## 2020-05-26 DIAGNOSIS — M9905 Segmental and somatic dysfunction of pelvic region: Secondary | ICD-10-CM | POA: Diagnosis not present

## 2020-05-26 DIAGNOSIS — M545 Low back pain: Secondary | ICD-10-CM | POA: Diagnosis not present

## 2020-05-27 DIAGNOSIS — M542 Cervicalgia: Secondary | ICD-10-CM | POA: Diagnosis not present

## 2020-05-27 DIAGNOSIS — M9905 Segmental and somatic dysfunction of pelvic region: Secondary | ICD-10-CM | POA: Diagnosis not present

## 2020-05-27 DIAGNOSIS — M545 Low back pain: Secondary | ICD-10-CM | POA: Diagnosis not present

## 2020-05-27 DIAGNOSIS — M9903 Segmental and somatic dysfunction of lumbar region: Secondary | ICD-10-CM | POA: Diagnosis not present

## 2020-05-28 DIAGNOSIS — M25532 Pain in left wrist: Secondary | ICD-10-CM | POA: Diagnosis not present

## 2020-05-28 DIAGNOSIS — M9905 Segmental and somatic dysfunction of pelvic region: Secondary | ICD-10-CM | POA: Diagnosis not present

## 2020-05-28 DIAGNOSIS — M9907 Segmental and somatic dysfunction of upper extremity: Secondary | ICD-10-CM | POA: Diagnosis not present

## 2020-05-28 DIAGNOSIS — M25531 Pain in right wrist: Secondary | ICD-10-CM | POA: Diagnosis not present

## 2020-06-01 DIAGNOSIS — M545 Low back pain: Secondary | ICD-10-CM | POA: Diagnosis not present

## 2020-06-01 DIAGNOSIS — M9903 Segmental and somatic dysfunction of lumbar region: Secondary | ICD-10-CM | POA: Diagnosis not present

## 2020-06-01 DIAGNOSIS — M542 Cervicalgia: Secondary | ICD-10-CM | POA: Diagnosis not present

## 2020-06-01 DIAGNOSIS — M9905 Segmental and somatic dysfunction of pelvic region: Secondary | ICD-10-CM | POA: Diagnosis not present

## 2020-06-04 DIAGNOSIS — M542 Cervicalgia: Secondary | ICD-10-CM | POA: Diagnosis not present

## 2020-06-04 DIAGNOSIS — M9903 Segmental and somatic dysfunction of lumbar region: Secondary | ICD-10-CM | POA: Diagnosis not present

## 2020-06-04 DIAGNOSIS — M9905 Segmental and somatic dysfunction of pelvic region: Secondary | ICD-10-CM | POA: Diagnosis not present

## 2020-06-04 DIAGNOSIS — M545 Low back pain: Secondary | ICD-10-CM | POA: Diagnosis not present

## 2020-06-08 DIAGNOSIS — M9903 Segmental and somatic dysfunction of lumbar region: Secondary | ICD-10-CM | POA: Diagnosis not present

## 2020-06-08 DIAGNOSIS — M545 Low back pain: Secondary | ICD-10-CM | POA: Diagnosis not present

## 2020-06-08 DIAGNOSIS — M9905 Segmental and somatic dysfunction of pelvic region: Secondary | ICD-10-CM | POA: Diagnosis not present

## 2020-06-08 DIAGNOSIS — M542 Cervicalgia: Secondary | ICD-10-CM | POA: Diagnosis not present

## 2020-06-09 ENCOUNTER — Encounter: Payer: Self-pay | Admitting: Advanced Practice Midwife

## 2020-06-09 ENCOUNTER — Other Ambulatory Visit (HOSPITAL_COMMUNITY)
Admission: RE | Admit: 2020-06-09 | Discharge: 2020-06-09 | Disposition: A | Discharge status: Home / Self Care | Payer: BLUE CROSS/BLUE SHIELD | Source: Ambulatory Visit | Attending: Advanced Practice Midwife | Admitting: Advanced Practice Midwife

## 2020-06-09 ENCOUNTER — Other Ambulatory Visit: Payer: Self-pay

## 2020-06-09 ENCOUNTER — Ambulatory Visit (INDEPENDENT_AMBULATORY_CARE_PROVIDER_SITE_OTHER): Payer: BLUE CROSS/BLUE SHIELD | Admitting: Advanced Practice Midwife

## 2020-06-09 VITALS — BP 103/61 | HR 71 | Resp 16 | Ht 63.0 in | Wt 129.0 lb

## 2020-06-09 DIAGNOSIS — M545 Low back pain: Secondary | ICD-10-CM | POA: Diagnosis not present

## 2020-06-09 DIAGNOSIS — R102 Pelvic and perineal pain: Secondary | ICD-10-CM | POA: Diagnosis not present

## 2020-06-09 DIAGNOSIS — M9905 Segmental and somatic dysfunction of pelvic region: Secondary | ICD-10-CM | POA: Diagnosis not present

## 2020-06-09 DIAGNOSIS — M542 Cervicalgia: Secondary | ICD-10-CM | POA: Diagnosis not present

## 2020-06-09 DIAGNOSIS — Z01419 Encounter for gynecological examination (general) (routine) without abnormal findings: Secondary | ICD-10-CM | POA: Insufficient documentation

## 2020-06-09 DIAGNOSIS — N393 Stress incontinence (female) (male): Secondary | ICD-10-CM

## 2020-06-09 DIAGNOSIS — M9903 Segmental and somatic dysfunction of lumbar region: Secondary | ICD-10-CM | POA: Diagnosis not present

## 2020-06-09 DIAGNOSIS — Z3169 Encounter for other general counseling and advice on procreation: Secondary | ICD-10-CM

## 2020-06-09 DIAGNOSIS — G8929 Other chronic pain: Secondary | ICD-10-CM

## 2020-06-09 NOTE — Progress Notes (Signed)
Subjective:     Olivia Gardner is a 29 y.o. female here at [facility] for a routine exam.  Current complaints: stress incontinence and pelvic pain since delivery 04/01/2019.     Gynecologic History Patient's last menstrual period was 05/29/2020. Contraception: natural family planning Last Pap: unknown. Results were: normal per pt   Obstetric History OB History  Gravida Para Term Preterm AB Living  1 1 1     1   SAB TAB Ectopic Multiple Live Births        0 1    # Outcome Date GA Lbr Len/2nd Weight Sex Delivery Anes PTL Lv  1 Term 04/01/19 [redacted]w[redacted]d 17:44 / 02:48 7 lb 6.9 oz (3.371 kg) M Vag-Spont EPI, Local  LIV     Birth Comments: skin tear scalp on right side of head     The following portions of the patient's history were reviewed and updated as appropriate: allergies, current medications, past family history, past medical history, past social history, past surgical history and problem list.  Review of Systems Pertinent items noted in HPI and remainder of comprehensive ROS otherwise negative.    Objective:   BP 103/61   Pulse 71   Resp 16   Ht 5\' 3"  (1.6 m)   Wt 129 lb (58.5 kg)   LMP 05/29/2020   Breastfeeding No   BMI 22.85 kg/m  VS reviewed, nursing note reviewed,  Constitutional: well developed, well nourished, no distress HEENT: normocephalic CV: normal rate Pulm/chest wall: normal effort Breast Exam:  Deferred with shared decision-making Abdomen: soft Neuro: alert and oriented x 3 Skin: warm, dry Psych: affect normal Pelvic exam: Cervix pink, visually closed, without lesion, scant white creamy discharge, vaginal walls and external genitalia normal Bimanual exam: Cervix 0/long/high, firm, anterior, neg CMT, uterus nontender, nonenlarged, adnexa without tenderness, enlargement, or mass     Assessment/Plan:   1. Well woman exam with routine gynecological exam  - Cytology - PAP( [place])  2. Stress incontinence in female --Leaks urine when  coughs/sneezes, sometimes has urge incontinence as well.  Started seeing chiropractor for pelvic pain and has seen some improvement. --Offered PT, pt would like to try exercises at home first, does not have time in schedule for PT, so printed information given. Pt to f/u as needed.   3. Encounter for preconception consultation --Desires another baby fall 2022 so plans to start trying later this year.  Discussed recommended folic acid/PNV prior to pregnancy.   4. Chronic pelvic pain in female --Pain since delivery, was 9 cm for several hours before delivery and thinks she had pelvic inflammation causing displacement/misalignment of pelvis/pelvic musculature.  Chiropractic is helping. Pt to continue, consider PT as needed.     Follow up in: 1 year or as needed.   07/29/2020, CNM 2:00 PM

## 2020-06-10 LAB — CYTOLOGY - PAP
Comment: NEGATIVE
Diagnosis: NEGATIVE
High risk HPV: NEGATIVE

## 2020-06-15 DIAGNOSIS — M9905 Segmental and somatic dysfunction of pelvic region: Secondary | ICD-10-CM | POA: Diagnosis not present

## 2020-06-15 DIAGNOSIS — M545 Low back pain: Secondary | ICD-10-CM | POA: Diagnosis not present

## 2020-06-15 DIAGNOSIS — M9903 Segmental and somatic dysfunction of lumbar region: Secondary | ICD-10-CM | POA: Diagnosis not present

## 2020-06-15 DIAGNOSIS — M542 Cervicalgia: Secondary | ICD-10-CM | POA: Diagnosis not present

## 2020-06-18 DIAGNOSIS — M9903 Segmental and somatic dysfunction of lumbar region: Secondary | ICD-10-CM | POA: Diagnosis not present

## 2020-06-18 DIAGNOSIS — M545 Low back pain: Secondary | ICD-10-CM | POA: Diagnosis not present

## 2020-06-18 DIAGNOSIS — M542 Cervicalgia: Secondary | ICD-10-CM | POA: Diagnosis not present

## 2020-06-18 DIAGNOSIS — M9905 Segmental and somatic dysfunction of pelvic region: Secondary | ICD-10-CM | POA: Diagnosis not present

## 2020-06-20 DIAGNOSIS — Z20822 Contact with and (suspected) exposure to covid-19: Secondary | ICD-10-CM | POA: Diagnosis not present

## 2020-06-20 DIAGNOSIS — Z03818 Encounter for observation for suspected exposure to other biological agents ruled out: Secondary | ICD-10-CM | POA: Diagnosis not present

## 2020-06-26 ENCOUNTER — Encounter: Payer: BLUE CROSS/BLUE SHIELD | Admitting: Physician Assistant

## 2020-07-02 DIAGNOSIS — M542 Cervicalgia: Secondary | ICD-10-CM | POA: Diagnosis not present

## 2020-07-02 DIAGNOSIS — M545 Low back pain: Secondary | ICD-10-CM | POA: Diagnosis not present

## 2020-07-02 DIAGNOSIS — M9903 Segmental and somatic dysfunction of lumbar region: Secondary | ICD-10-CM | POA: Diagnosis not present

## 2020-07-02 DIAGNOSIS — M9905 Segmental and somatic dysfunction of pelvic region: Secondary | ICD-10-CM | POA: Diagnosis not present

## 2020-07-15 DIAGNOSIS — F411 Generalized anxiety disorder: Secondary | ICD-10-CM | POA: Diagnosis not present

## 2020-07-24 ENCOUNTER — Encounter: Payer: Self-pay | Admitting: Physician Assistant

## 2020-07-24 NOTE — Telephone Encounter (Signed)
Error

## 2020-07-27 ENCOUNTER — Telehealth: Payer: Self-pay

## 2020-07-27 NOTE — Telephone Encounter (Signed)
Ok to order those requested AND cmp and cbc.

## 2020-07-27 NOTE — Telephone Encounter (Signed)
Pt called stating that she has a CPE scheduled for 07/31/2020 and is requesting labs be drawn prior to her CPE so that she can discuss results with provider at this appointment.  Pt is requesting lipid, B12, iron and Vitamin D in additional to traditional labs.  Please order appropriate labs so that pt may come to the office to pick up orders.  Tiajuana Amass, CMA

## 2020-07-31 ENCOUNTER — Encounter: Payer: Self-pay | Admitting: Physician Assistant

## 2020-07-31 ENCOUNTER — Ambulatory Visit (INDEPENDENT_AMBULATORY_CARE_PROVIDER_SITE_OTHER): Payer: 59 | Admitting: Physician Assistant

## 2020-07-31 VITALS — BP 114/67 | HR 69 | Ht 63.0 in | Wt 128.0 lb

## 2020-07-31 DIAGNOSIS — Z0000 Encounter for general adult medical examination without abnormal findings: Secondary | ICD-10-CM

## 2020-07-31 DIAGNOSIS — R7989 Other specified abnormal findings of blood chemistry: Secondary | ICD-10-CM | POA: Insufficient documentation

## 2020-07-31 DIAGNOSIS — F5089 Other specified eating disorder: Secondary | ICD-10-CM

## 2020-07-31 DIAGNOSIS — R790 Abnormal level of blood mineral: Secondary | ICD-10-CM | POA: Diagnosis not present

## 2020-07-31 DIAGNOSIS — E7800 Pure hypercholesterolemia, unspecified: Secondary | ICD-10-CM

## 2020-07-31 DIAGNOSIS — E559 Vitamin D deficiency, unspecified: Secondary | ICD-10-CM

## 2020-07-31 DIAGNOSIS — Z131 Encounter for screening for diabetes mellitus: Secondary | ICD-10-CM

## 2020-07-31 NOTE — Patient Instructions (Addendum)
unisom for sleep.   Insomnia Insomnia is a sleep disorder that makes it difficult to fall asleep or stay asleep. Insomnia can cause fatigue, low energy, difficulty concentrating, mood swings, and poor performance at work or school. There are three different ways to classify insomnia:  Difficulty falling asleep.  Difficulty staying asleep.  Waking up too early in the morning. Any type of insomnia can be long-term (chronic) or short-term (acute). Both are common. Short-term insomnia usually lasts for three months or less. Chronic insomnia occurs at least three times a week for longer than three months. What are the causes? Insomnia may be caused by another condition, situation, or substance, such as:  Anxiety.  Certain medicines.  Gastroesophageal reflux disease (GERD) or other gastrointestinal conditions.  Asthma or other breathing conditions.  Restless legs syndrome, sleep apnea, or other sleep disorders.  Chronic pain.  Menopause.  Stroke.  Abuse of alcohol, tobacco, or illegal drugs.  Mental health conditions, such as depression.  Caffeine.  Neurological disorders, such as Alzheimer's disease.  An overactive thyroid (hyperthyroidism). Sometimes, the cause of insomnia may not be known. What increases the risk? Risk factors for insomnia include:  Gender. Women are affected more often than men.  Age. Insomnia is more common as you get older.  Stress.  Lack of exercise.  Irregular work schedule or working night shifts.  Traveling between different time zones.  Certain medical and mental health conditions. What are the signs or symptoms? If you have insomnia, the main symptom is having trouble falling asleep or having trouble staying asleep. This may lead to other symptoms, such as:  Feeling fatigued or having low energy.  Feeling nervous about going to sleep.  Not feeling rested in the morning.  Having trouble concentrating.  Feeling irritable,  anxious, or depressed. How is this diagnosed? This condition may be diagnosed based on:  Your symptoms and medical history. Your health care provider may ask about: ? Your sleep habits. ? Any medical conditions you have. ? Your mental health.  A physical exam. How is this treated? Treatment for insomnia depends on the cause. Treatment may focus on treating an underlying condition that is causing insomnia. Treatment may also include:  Medicines to help you sleep.  Counseling or therapy.  Lifestyle adjustments to help you sleep better. Follow these instructions at home: Eating and drinking   Limit or avoid alcohol, caffeinated beverages, and cigarettes, especially close to bedtime. These can disrupt your sleep.  Do not eat a large meal or eat spicy foods right before bedtime. This can lead to digestive discomfort that can make it hard for you to sleep. Sleep habits   Keep a sleep diary to help you and your health care provider figure out what could be causing your insomnia. Write down: ? When you sleep. ? When you wake up during the night. ? How well you sleep. ? How rested you feel the next day. ? Any side effects of medicines you are taking. ? What you eat and drink.  Make your bedroom a dark, comfortable place where it is easy to fall asleep. ? Put up shades or blackout curtains to block light from outside. ? Use a white noise machine to block noise. ? Keep the temperature cool.  Limit screen use before bedtime. This includes: ? Watching TV. ? Using your smartphone, tablet, or computer.  Stick to a routine that includes going to bed and waking up at the same times every day and night. This can help  you fall asleep faster. Consider making a quiet activity, such as reading, part of your nighttime routine.  Try to avoid taking naps during the day so that you sleep better at night.  Get out of bed if you are still awake after 15 minutes of trying to sleep. Keep the  lights down, but try reading or doing a quiet activity. When you feel sleepy, go back to bed. General instructions  Take over-the-counter and prescription medicines only as told by your health care provider.  Exercise regularly, as told by your health care provider. Avoid exercise starting several hours before bedtime.  Use relaxation techniques to manage stress. Ask your health care provider to suggest some techniques that may work well for you. These may include: ? Breathing exercises. ? Routines to release muscle tension. ? Visualizing peaceful scenes.  Make sure that you drive carefully. Avoid driving if you feel very sleepy.  Keep all follow-up visits as told by your health care provider. This is important. Contact a health care provider if:  You are tired throughout the day.  You have trouble in your daily routine due to sleepiness.  You continue to have sleep problems, or your sleep problems get worse. Get help right away if:  You have serious thoughts about hurting yourself or someone else. If you ever feel like you may hurt yourself or others, or have thoughts about taking your own life, get help right away. You can go to your nearest emergency department or call:  Your local emergency services (911 in the [place]  A suicide crisis helpline, such as the National Suicide Prevention Lifeline at 514 116 6235. This is open 24 hours a day. Summary  Insomnia is a sleep disorder that makes it difficult to fall asleep or stay asleep.  Insomnia can be long-term (chronic) or short-term (acute).  Treatment for insomnia depends on the cause. Treatment may focus on treating an underlying condition that is causing insomnia.  Keep a sleep diary to help you and your health care provider figure out what could be causing your insomnia. This information is not intended to replace advice given to you by your health care provider. Make sure you discuss any questions you have with your  health care provider. Document Revised: 11/24/2017 Document Reviewed: 09/21/2017 Elsevier Patient Education  2020 [employer].   Health Maintenance, Female Adopting a healthy lifestyle and getting preventive care are important in promoting health and wellness. Ask your health care provider about:  The right schedule for you to have regular tests and exams.  Things you can do on your own to prevent diseases and keep yourself healthy. What should I know about diet, weight, and exercise? Eat a healthy diet   Eat a diet that includes plenty of vegetables, fruits, low-fat dairy products, and lean protein.  Do not eat a lot of foods that are high in solid fats, added sugars, or sodium. Maintain a healthy weight Body mass index (BMI) is used to identify weight problems. It estimates body fat based on height and weight. Your health care provider can help determine your BMI and help you achieve or maintain a healthy weight. Get regular exercise Get regular exercise. This is one of the most important things you can do for your health. Most adults should:  Exercise for at least 150 minutes each week. The exercise should increase your heart rate and make you sweat (moderate-intensity exercise).  Do strengthening exercises at least twice a week. This is in addition to the moderate-intensity  exercise.  Spend less time sitting. Even light physical activity can be beneficial. Watch cholesterol and blood lipids Have your blood tested for lipids and cholesterol at 31 years of age, then have this test every 5 years. Have your cholesterol levels checked more often if:  Your lipid or cholesterol levels are high.  You are older than 31 years of age.  You are at high risk for heart disease. What should I know about cancer screening? Depending on your health history and family history, you may need to have cancer screening at various ages. This may include screening for:  Breast cancer.  Cervical  cancer.  Colorectal cancer.  Skin cancer.  Lung cancer. What should I know about heart disease, diabetes, and high blood pressure? Blood pressure and heart disease  High blood pressure causes heart disease and increases the risk of stroke. This is more likely to develop in people who have high blood pressure readings, are of African descent, or are overweight.  Have your blood pressure checked: ? Every 3-5 years if you are 12-52 years of age. ? Every year if you are 32 years old or older. Diabetes Have regular diabetes screenings. This checks your fasting blood sugar level. Have the screening done:  Once every three years after age 63 if you are at a normal weight and have a low risk for diabetes.  More often and at a younger age if you are overweight or have a high risk for diabetes. What should I know about preventing infection? Hepatitis B If you have a higher risk for hepatitis B, you should be screened for this virus. Talk with your health care provider to find out if you are at risk for hepatitis B infection. Hepatitis C Testing is recommended for:  Everyone born from [birth year] through 1967.  Anyone with known risk factors for hepatitis C. Sexually transmitted infections (STIs)  Get screened for STIs, including gonorrhea and chlamydia, if: ? You are sexually active and are younger than 31 years of age. ? You are older than 31 years of age and your health care provider tells you that you are at risk for this type of infection. ? Your sexual activity has changed since you were last screened, and you are at increased risk for chlamydia or gonorrhea. Ask your health care provider if you are at risk.  Ask your health care provider about whether you are at high risk for HIV. Your health care provider may recommend a prescription medicine to help prevent HIV infection. If you choose to take medicine to prevent HIV, you should first get tested for HIV. You should then be tested every 3  months for as long as you are taking the medicine. Pregnancy  If you are about to stop having your period (premenopausal) and you may become pregnant, seek counseling before you get pregnant.  Take 400 to 800 micrograms (mcg) of folic acid every day if you become pregnant.  Ask for birth control (contraception) if you want to prevent pregnancy. Osteoporosis and menopause Osteoporosis is a disease in which the bones lose minerals and strength with aging. This can result in bone fractures. If you are 6 years old or older, or if you are at risk for osteoporosis and fractures, ask your health care provider if you should:  Be screened for bone loss.  Take a calcium or vitamin D supplement to lower your risk of fractures.  Be given hormone replacement therapy (HRT) to treat symptoms of menopause. Follow these  instructions at home: Lifestyle  Do not use any products that contain nicotine or tobacco, such as cigarettes, e-cigarettes, and chewing tobacco. If you need help quitting, ask your health care provider.  Do not use street drugs.  Do not share needles.  Ask your health care provider for help if you need support or information about quitting drugs. Alcohol use  Do not drink alcohol if: ? Your health care provider tells you not to drink. ? You are pregnant, may be pregnant, or are planning to become pregnant.  If you drink alcohol: ? Limit how much you use to 0-1 drink a day. ? Limit intake if you are breastfeeding.  Be aware of how much alcohol is in your drink. In the [place] one drink equals one 12 oz bottle of beer (355 mL), one 5 oz glass of wine (148 mL), or one 1 oz glass of hard liquor (44 mL). General instructions  Schedule regular health, dental, and eye exams.  Stay current with your vaccines.  Tell your health care provider if: ? You often feel depressed. ? You have ever been abused or do not feel safe at home. Summary  Adopting a healthy lifestyle and getting  preventive care are important in promoting health and wellness.  Follow your health care provider's instructions about healthy diet, exercising, and getting tested or screened for diseases.  Follow your health care provider's instructions on monitoring your cholesterol and blood pressure. This information is not intended to replace advice given to you by your health care provider. Make sure you discuss any questions you have with your health care provider. Document Revised: 12/05/2018 Document Reviewed: 12/05/2018 Elsevier Patient Education  2020 [employer]

## 2020-07-31 NOTE — Progress Notes (Signed)
Subjective:     Olivia Gardner is a 29 y.o. female and is here for a comprehensive physical exam. The patient reports problems - staying asleep. she feels she does not get a good nights sleep. she has hx of low iron stores and low TSH she wonders if that could be causing any problems.  she has noticed she craves ice. Heavy periods does not wish to be treated. Does not like oral iron due to constipation side effects.   Social History   Socioeconomic History  . Marital status: Single    Spouse name: Not on file  . Number of children: Not on file  . Years of education: Not on file  . Highest education level: Not on file  Occupational History  . Not on file  Tobacco Use  . Smoking status: Never Smoker  . Smokeless tobacco: Never Used  Vaping Use  . Vaping Use: Never used  Substance and Sexual Activity  . Alcohol use: Not Currently    Alcohol/week: 0.0 standard drinks  . Drug use: No  . Sexual activity: Yes    Birth control/protection: Inserts  Other Topics Concern  . Not on file  Social History Narrative  . Not on file   Social Determinants of Health   Financial Resource Strain:   . Difficulty of Paying Living Expenses:   Food Insecurity:   . Worried About Programme researcher, broadcasting/film/video in the Last Year:   . Barista in the Last Year:   Transportation Needs:   . Freight forwarder (Medical):   Marland Kitchen Lack of Transportation (Non-Medical):   Physical Activity:   . Days of Exercise per Week:   . Minutes of Exercise per Session:   Stress:   . Feeling of Stress :   Social Connections:   . Frequency of Communication with Friends and Family:   . Frequency of Social Gatherings with Friends and Family:   . Attends Religious Services:   . Active Member of Clubs or Organizations:   . Attends Banker Meetings:   Marland Kitchen Marital Status:   Intimate Partner Violence:   . Fear of Current or Ex-Partner:   . Emotionally Abused:   Marland Kitchen Physically Abused:   . Sexually Abused:     Health Maintenance  Topic Date Due  . INFLUENZA VACCINE  07/26/2020  . Hepatitis C Screening  07/31/2021 (Originally 09/18/1991)  . PAP SMEAR-Modifier  06/10/2023  . TETANUS/TDAP  01/01/2029  . COVID-19 Vaccine  Completed  . HIV Screening  Completed    The following portions of the patient's history were reviewed and updated as appropriate: allergies, current medications, past family history, past medical history, past social history, past surgical history and problem list.  Review of Systems Pertinent items noted in HPI and remainder of comprehensive ROS otherwise negative.   Objective:    BP 114/67   Pulse 69   Ht 5\' 3"  (1.6 m)   Wt 128 lb (58.1 kg)   SpO2 100%   BMI 22.67 kg/m  General appearance: alert, cooperative and appears stated age Head: Normocephalic, without obvious abnormality, atraumatic Eyes: conjunctivae/corneas clear. PERRL, EOM's intact. Fundi benign. Ears: normal TM's and external ear canals both ears Nose: Nares normal. Septum midline. Mucosa normal. No drainage or sinus tenderness. Throat: lips, mucosa, and tongue normal; teeth and gums normal Neck: no adenopathy, no carotid bruit, no JVD, supple, symmetrical, trachea midline and thyroid not enlarged, symmetric, no tenderness/mass/nodules Back: symmetric, no curvature. ROM normal. No CVA  tenderness. Lungs: clear to auscultation bilaterally Heart: regular rate and rhythm, S1, S2 normal, no murmur, click, rub or gallop Abdomen: soft, non-tender; bowel sounds normal; no masses,  no organomegaly Extremities: extremities normal, atraumatic, no cyanosis or edema Pulses: 2+ and symmetric Skin: Skin color, texture, turgor normal. No rashes or lesions Lymph nodes: Cervical, supraclavicular, and axillary nodes normal. Neurologic: Alert and oriented X 3, normal strength and tone. Normal symmetric reflexes. Normal coordination and gait    Assessment:    Healthy female exam.      Plan:    Marland KitchenMarland KitchenTiffany was seen  today for annual exam.  Diagnoses and all orders for this visit:  Routine physical examination -     Lipid Panel w/reflex Direct LDL -     COMPLETE METABOLIC PANEL WITH GFR -     TSH -     CBC -     VITAMIN D 25 Hydroxy (Vit-D Deficiency, Fractures) -     Fe+TIBC+Fer  Elevated LDL cholesterol level -     Lipid Panel w/reflex Direct LDL  Low iron stores -     CBC -     Fe+TIBC+Fer  Vitamin D deficiency -     VITAMIN D 25 Hydroxy (Vit-D Deficiency, Fractures)  Pica in adults -     CBC -     Fe+TIBC+Fer  Screening for diabetes mellitus -     COMPLETE METABOLIC PANEL WITH GFR  Low TSH level -     TSH   .Marland Kitchen Discussed 150 minutes of exercise a week.  Encouraged vitamin D 1000 units and Calcium 1300mg  or 4 servings of dairy a day.  Fasting labs ordered.  Pap UTD.  Vaccines UTD.  Needs labs to follow up on iron stores and low TSH. Both could effect sleep. Discussed insomnia. Consider unisom. HO given. She is planning to become pregnant this year. Could consider belsomra/dayvigo for sleep after having baby.  Restart Prenatal vitamin. Take with vitamin C.   See After Visit Summary for Counseling Recommendations

## 2020-08-01 LAB — CBC
HCT: 32.3 % — ABNORMAL LOW (ref 35.0–45.0)
Hemoglobin: 10 g/dL — ABNORMAL LOW (ref 11.7–15.5)
MCH: 23.3 pg — ABNORMAL LOW (ref 27.0–33.0)
MCHC: 31 g/dL — ABNORMAL LOW (ref 32.0–36.0)
MCV: 75.1 fL — ABNORMAL LOW (ref 80.0–100.0)
MPV: 11.5 fL (ref 7.5–12.5)
Platelets: 262 10*3/uL (ref 140–400)
RBC: 4.3 10*6/uL (ref 3.80–5.10)
RDW: 15.8 % — ABNORMAL HIGH (ref 11.0–15.0)
WBC: 6.3 10*3/uL (ref 3.8–10.8)

## 2020-08-01 LAB — LIPID PANEL W/REFLEX DIRECT LDL
Cholesterol: 219 mg/dL — ABNORMAL HIGH (ref <200)
HDL: 62 mg/dL (ref >=50)
LDL Cholesterol (Calc): 131 mg/dL (calc) — ABNORMAL HIGH
Non-HDL Cholesterol (Calc): 157 mg/dL (calc) — ABNORMAL HIGH (ref <130)
Total CHOL/HDL Ratio: 3.5 (calc) (ref <5.0)
Triglycerides: 144 mg/dL (ref <150)

## 2020-08-01 LAB — IRON,TIBC AND FERRITIN PANEL
%SAT: 6 % (calc) — ABNORMAL LOW (ref 16–45)
Ferritin: 3 ng/mL — ABNORMAL LOW (ref 16–154)
Iron: 25 ug/dL — ABNORMAL LOW (ref 40–190)
TIBC: 409 mcg/dL (calc) (ref 250–450)

## 2020-08-01 LAB — COMPLETE METABOLIC PANEL WITH GFR
AG Ratio: 1.8 (calc) (ref 1.0–2.5)
ALT: 9 U/L (ref 6–29)
AST: 15 U/L (ref 10–30)
Albumin: 4.7 g/dL (ref 3.6–5.1)
Alkaline phosphatase (APISO): 53 U/L (ref 31–125)
BUN: 10 mg/dL (ref 7–25)
CO2: 25 mmol/L (ref 20–32)
Calcium: 9.2 mg/dL (ref 8.6–10.2)
Chloride: 107 mmol/L (ref 98–110)
Creat: 0.71 mg/dL (ref 0.50–1.10)
GFR, Est African American: 132 mL/min/{1.73_m2} (ref >=60)
GFR, Est Non African American: 114 mL/min/{1.73_m2} (ref >=60)
Globulin: 2.6 g/dL (calc) (ref 1.9–3.7)
Glucose, Bld: 80 mg/dL (ref 65–99)
Potassium: 4.4 mmol/L (ref 3.5–5.3)
Sodium: 138 mmol/L (ref 135–146)
Total Bilirubin: 0.8 mg/dL (ref 0.2–1.2)
Total Protein: 7.3 g/dL (ref 6.1–8.1)

## 2020-08-01 LAB — TSH: TSH: 4.14 mIU/L

## 2020-08-01 LAB — VITAMIN D 25 HYDROXY (VIT D DEFICIENCY, FRACTURES): Vit D, 25-Hydroxy: 17 ng/mL — ABNORMAL LOW (ref 30–100)

## 2020-08-02 NOTE — Progress Notes (Signed)
Tiffany,   Thyroid in normal range but upper limits of normal but now trending towards HYPO(low) thyroid.  Vitamin D low. Need to be taking 1000-2000units daily.  You are anemic. Would you like to try the fusion plus iron supplement? You could also go back on prenatal and recheck labs in 2 to 3 months to see if come up some.  Kidney, liver, glucose look great.  HDL looks great.  LDL better.

## 2020-12-22 ENCOUNTER — Other Ambulatory Visit (INDEPENDENT_AMBULATORY_CARE_PROVIDER_SITE_OTHER): Payer: 59

## 2020-12-22 ENCOUNTER — Other Ambulatory Visit: Payer: Self-pay

## 2020-12-22 DIAGNOSIS — N926 Irregular menstruation, unspecified: Secondary | ICD-10-CM

## 2020-12-22 NOTE — Progress Notes (Signed)
Pt requesting quant. Pt has had some negative UPT's at home and also some positive UPT's at home. Pt sent to lab.

## 2020-12-23 LAB — HCG, QUANTITATIVE, PREGNANCY: HCG, Total, QN: 45 m[IU]/mL

## 2020-12-26 NOTE — L&D Delivery Note (Addendum)
OB/GYN Faculty Practice Delivery Note  Olivia Gardner is a 30 y.o. G2P1001 at [redacted]w[redacted]d. She was admitted for IOL for IUGR.   ROM: rupture date, rupture time, delivery date, or delivery time have not been documented AROM at 0240 with clear fluid GBS Status: neg Maximum Maternal Temperature: 98.5*f  Labor Progress: Patient was induced with foley balloon and cytotec. She then progressed to complete with pitocin and AROM.  Delivery Date/Time: 08/15/21 at 0323 Delivery: Called to room and patient was complete and pushing. Head delivered ROA. Nuchal cord present x1 plus shawl. Shoulder and body delivered in usual fashion. Infant with spontaneous cry, placed on mother's abdomen, dried and stimulated. Cord clamped x 2 after 1-minute delay, and cut by FOB. Cord blood drawn. Placenta delivered with gentle cord traction, trailing membranes removed manually by Olivia Gardner, CMN. Fundus firm with massage and Pitocin. Labia, perineum, vagina, and cervix inspected with first degree perineal laceration.   Placenta: 3VC, intact Complications: trailing membranes removed manually Lacerations: 1st degree perineal laceration repaired with 3-0 vicryl EBL: 150cc Analgesia: epidural  Postpartum Planning [ ]  message to sent to schedule follow-up  [ ]  vaccines UTD  Infant: viable female  APGARs 9,9  weight pending medical chart  , MD [facility], PGY-3 08/15/2021, 4:34 AM    I was gloved and present for entire delivery SVD without incident No difficulty with shoulders Lacerations as listed above Repair of same supervised by me  [facility], CNM 08/15/21 7:19 AM

## 2020-12-26 NOTE — L&D Delivery Note (Deleted)
OBSTETRIC ADMISSION HISTORY AND PHYSICAL  Olivia Gardner is a 30 y.o. female G2P1001 with IUP at [redacted]w[redacted]d by LMP presenting for IOL for IUGR. She reports +FMs, No LOF, no VB, no blurry vision, headaches or peripheral edema, and RUQ pain.  She plans on breast feeding. She request post placental IUD for birth control. She received her prenatal care at  [facility]    Dating: By LMP --->  Estimated Date of Delivery: 08/30/21  Sono:    @[redacted]w[redacted]d , CWD, normal anatomy, Cephalic presentation, 2546 g, 11% EFW   Prenatal History/Complications: IUGR, umbilical vein abnormality (resolved on scan on 8/2)  Past Medical History: Past Medical History:  Diagnosis Date   Abnormal weight gain 06/25/2019   Hyperlipidemia    Vitamin D deficiency     Past Surgical History: Past Surgical History:  Procedure Laterality Date   WISDOM TOOTH EXTRACTION      Obstetrical History: OB History     Gravida  2   Para  1   Term  1   Preterm      AB      Living  1      SAB      IAB      Ectopic      Multiple  0   Live Births  1           Social History Social History   Socioeconomic History   Marital status: Married    Spouse name: Not on file   Number of children: Not on file   Years of education: Not on file   Highest education level: Not on file  Occupational History   Not on file  Tobacco Use   Smoking status: Never   Smokeless tobacco: Never  Vaping Use   Vaping Use: Never used  Substance and Sexual Activity   Alcohol use: Not Currently    Alcohol/week: 0.0 standard drinks   Drug use: No   Sexual activity: Yes    Birth control/protection: Inserts  Other Topics Concern   Not on file  Social History Narrative   Not on file   Social Determinants of Health   Financial Resource Strain: Not on file  Food Insecurity: Not on file  Transportation Needs: Not on file  Physical Activity: Not on file  Stress: Not on file  Social Connections: Not on file    Family  History: Family History  Problem Relation Age of Onset   Hyperlipidemia Mother    Hypertension Father    Diabetes Maternal Grandmother    Diabetes Paternal Grandmother    Asthma Neg Hx    Stroke Neg Hx     Allergies: Allergies  Allergen Reactions   Sulfacetamide Sodium Other (See Comments)    Per Patient caused damage to her eye to the point she has to get new prescription lenses.  Her eye doctor wanted it noted she can not take these any more.    Medications Prior to Admission  Medication Sig Dispense Refill Last Dose   acetaminophen (TYLENOL) 500 MG tablet Take 500 mg by mouth every 6 (six) hours as needed.      cetirizine (ZYRTEC) 10 MG tablet Take 10 mg by mouth daily.      Cholecalciferol (VITAMIN D) 50 MCG (2000 UT) tablet       ferrous sulfate (FERROUSUL) 325 (65 FE) MG tablet Take 1 tablet (325 mg total) by mouth every other day. (Patient taking differently: Take 325 mg by mouth daily.) 30 tablet 2  nystatin cream (MYCOSTATIN) One full applicator (one gram) inserted vaginally for fourteen nights 15 g 0    polyethylene glycol (MIRALAX / GLYCOLAX) 17 g packet Take 17 g by mouth daily.      Prenat-Fe Carbonyl-FA-Omega 3 (ONE-A-DAY WOMENS PRENATAL 1) 28-0.8-235 MG CAPS         Review of Systems   All systems reviewed and negative except as stated in HPI  Blood pressure 128/86, pulse 68, temperature 98.5 F (36.9 C), temperature source Oral, resp. rate 18, height 5' 0.5" (1.537 m), weight 71.7 kg, last menstrual period 11/23/2020. General appearance: alert, cooperative, appears stated age, and no distress Lungs: normal work of breathing on room air  Heart: regular rate and rhythm Abdomen: soft, non-tender; bowel sounds normal Pelvic: Normal external female genitalia Extremities: Homans sign is negative, no sign of DVT Presentation: cephalic Fetal monitoringBaseline: 140 bpm, Variability: Good {> 6 bpm), and Accelerations: Reactive Uterine activity:  now having  spontaneous contractions on monitor, spaced every 3-4 minutes, but not feeling them strongly Dilation: 2 Effacement (%): Thick Station: -3 Exam by:: Delrae Sawyers RN   Prenatal labs: ABO, Rh: --/--/O POS (08/20 1035) Antibody: NEG (08/20 1035) Rubella: 4.93 (02/08 1559) RPR: NON REACTIVE (08/20 1030)  HBsAg: NON-REACTIVE (02/08 1559)  HIV: NON-REACTIVE (06/14 0900)  GBS:   Neg 1 hr Glucola WNL Genetic screening  Low Risk NIPS Anatomy US Normal   Prenatal Transfer Tool  Maternal Diabetes: No Genetic Screening: Normal Maternal Ultrasounds/Referrals: IUGR Fetal Ultrasounds or other Referrals:  None Maternal Substance Abuse:  No Significant Maternal Medications:  None Significant Maternal Lab Results: Group B Strep negative  Results for orders placed or performed during the hospital encounter of 08/14/21 (from the past 24 hour(s))  CBC   Collection Time: 08/14/21 10:30 AM  Result Value Ref Range   WBC 11.8 (H) 4.0 - 10.5 K/uL   RBC 4.26 3.87 - 5.11 MIL/uL   Hemoglobin 10.3 (L) 12.0 - 15.0 g/dL   HCT 17.5 (L) 10.2 - 58.5 %   MCV 80.8 80.0 - 100.0 fL   MCH 24.2 (L) 26.0 - 34.0 pg   MCHC 29.9 (L) 30.0 - 36.0 g/dL   RDW 27.7 (H) 82.4 - 23.5 %   Platelets 240 150 - 400 K/uL   nRBC 0.0 0.0 - 0.2 %  RPR   Collection Time: 08/14/21 10:30 AM  Result Value Ref Range   RPR Ser Ql NON REACTIVE NON REACTIVE  Type and screen   Collection Time: 08/14/21 10:35 AM  Result Value Ref Range   ABO/RH(D) O POS    Antibody Screen NEG    Sample Expiration      08/17/2021,2359 Performed at [facility], [address], [place], [place] [address]     Patient Active Problem List   Diagnosis Date Noted   Poor fetal growth 08/14/2021   Pregnancy affected by fetal growth restriction 07/29/2021   Anemia, antepartum 07/19/2021   Pelvic pain affecting pregnancy 05/17/2021   Supervision of other normal pregnancy, antepartum 02/03/2021   Pica in adults 07/31/2020   Elevated TSH  07/31/2020   Other dietary vitamin B12 deficiency anemia 09/24/2019   Post partum thyroiditis 06/26/2019   Vitamin D deficiency 03/28/2016   Low iron stores 03/28/2016   Hyperlipidemia 03/25/2016   Ganglion of right wrist 03/25/2016   Epicondylitis elbow, medial 02/02/2015   Tendonitis 01/30/2015   Elevated LDL cholesterol level 01/30/2015   Tinnitus 01/30/2015    Assessment/Plan:  Olivia Gardner  is a 30 y.o. G2P1001 at [redacted]w[redacted]d here for IOL for IUGR  #Labor: Not in labor on presentation, will start induction with foley and cytotec #Pain: PRN, planning for epidural [name]: Cat I #ID:  GBS neg #MOF: Breast #MOC:PP IUD, consented #Circ:  NA, girl  Dorothyann Gibbs, MD  08/14/2021, 1:05 PM

## 2021-02-02 ENCOUNTER — Encounter: Payer: Self-pay | Admitting: Certified Nurse Midwife

## 2021-02-02 ENCOUNTER — Ambulatory Visit (INDEPENDENT_AMBULATORY_CARE_PROVIDER_SITE_OTHER): Payer: 59 | Admitting: Certified Nurse Midwife

## 2021-02-02 ENCOUNTER — Other Ambulatory Visit (HOSPITAL_COMMUNITY)
Admission: RE | Admit: 2021-02-02 | Discharge: 2021-02-02 | Disposition: A | Discharge status: Home / Self Care | Payer: 59 | Source: Ambulatory Visit | Attending: Certified Nurse Midwife | Admitting: Certified Nurse Midwife

## 2021-02-02 ENCOUNTER — Other Ambulatory Visit: Payer: Self-pay

## 2021-02-02 VITALS — BP 115/73 | HR 79 | Wt 134.0 lb

## 2021-02-02 DIAGNOSIS — Z3A10 10 weeks gestation of pregnancy: Secondary | ICD-10-CM

## 2021-02-02 DIAGNOSIS — Z3480 Encounter for supervision of other normal pregnancy, unspecified trimester: Secondary | ICD-10-CM

## 2021-02-02 LAB — OB RESULTS CONSOLE GC/CHLAMYDIA: Gonorrhea: NEGATIVE

## 2021-02-02 NOTE — Progress Notes (Unsigned)
Bedside U/S shows single IUP with FHT of 181 BPM and CRL measures 27.36mm GA [redacted]w[redacted]d

## 2021-02-03 ENCOUNTER — Encounter: Payer: Self-pay | Admitting: Certified Nurse Midwife

## 2021-02-03 DIAGNOSIS — Z3480 Encounter for supervision of other normal pregnancy, unspecified trimester: Secondary | ICD-10-CM | POA: Insufficient documentation

## 2021-02-03 LAB — GC/CHLAMYDIA PROBE AMP (~~LOC~~) NOT AT ARMC
Chlamydia: NEGATIVE
Comment: NEGATIVE
Comment: NORMAL
Neisseria Gonorrhea: NEGATIVE

## 2021-02-03 LAB — TSH: TSH: 2.99 mIU/L

## 2021-02-03 NOTE — Progress Notes (Signed)
History:   Olivia Gardner is a 30 y.o. G2P1001 at 105w2d by LMP being seen today for her first obstetrical visit.  Her obstetrical history is significant for history of elevated TSH. Patient does intend to breast feed. Pregnancy history fully reviewed.  Patient reports nausea.     HISTORY: OB History  Gravida Para Term Preterm AB Living  2 1 1  0 0 1  SAB IAB Ectopic Multiple Live Births  0 0 0 0 1    # Outcome Date GA Lbr Len/2nd Weight Sex Delivery Anes PTL Lv  2 Current           1 Term 04/01/19 [redacted]w[redacted]d 17:44 / 02:48 7 lb 6.9 oz (3.371 kg) M Vag-Spont EPI, Local  LIV     Birth Comments: skin tear scalp on right side of head     Name: Rosetti,BOY TIFFANY     Apgar1: 1  Apgar5: 6    Last pap smear was done 05/2020 and was normal  Past Medical History:  Diagnosis Date  . Abnormal weight gain 06/25/2019  . Hyperlipidemia   . Vitamin D deficiency    Past Surgical History:  Procedure Laterality Date  . WISDOM TOOTH EXTRACTION     Family History  Problem Relation Age of Onset  . Hyperlipidemia Mother   . Hypertension Father   . Diabetes Maternal Grandmother   . Diabetes Paternal Grandmother   . Asthma Neg Hx   . Stroke Neg Hx    Social History   Tobacco Use  . Smoking status: Never Smoker  . Smokeless tobacco: Never Used  Vaping Use  . Vaping Use: Never used  Substance Use Topics  . Alcohol use: Not Currently    Alcohol/week: 0.0 standard drinks  . Drug use: No   Allergies  Allergen Reactions  . Sulfacetamide Sodium Other (See Comments)    Per Patient caused damage to her eye to the point she has to get new prescription lenses.  Her eye doctor wanted it noted she can not take these any more.   Current Outpatient Medications on File Prior to Visit  Medication Sig Dispense Refill  . Prenat-Fe Carbonyl-FA-Omega 3 (ONE-A-DAY WOMENS PRENATAL 1) 28-0.8-235 MG CAPS     . ibuprofen (ADVIL,MOTRIN) 600 MG tablet Take 1 tablet (600 mg total) by mouth every 6 (six) hours.  30 tablet 0  . loratadine (CLARITIN) 10 MG tablet Take 10 mg by mouth daily.     No current facility-administered medications on file prior to visit.    Review of Systems Pertinent items noted in HPI and remainder of comprehensive ROS otherwise negative.  Physical Exam:   Vitals:   02/02/21 1554  BP: 115/73  Pulse: 79  Weight: 134 lb (60.8 kg)   Fetal Heart Rate (bpm): 181  General: well-developed, well-nourished female in no acute distress  Breasts:  normal appearance, no masses or tenderness bilaterally  Skin: normal coloration and turgor, no rashes  Neurologic: oriented, normal, negative, normal mood  Extremities: normal strength, tone, and muscle mass, ROM of all joints is normal  HEENT PERRLA, extraocular movement intact and sclera clear  Neck supple and no masses  Cardiovascular: regular rate and rhythm  Respiratory:  no respiratory distress, normal breath sounds  Abdomen: soft, non-tender; bowel sounds normal; no masses,  no organomegaly    Assessment:    Pregnancy: G2P1001 Patient Active Problem List   Diagnosis Date Noted  . Pica in adults 07/31/2020  . Elevated TSH 07/31/2020  . Other  dietary vitamin B12 deficiency anemia 09/24/2019  . Post partum thyroiditis 06/26/2019  . Hair loss 06/25/2019  . Vitamin D deficiency 03/28/2016  . Low iron stores 03/28/2016  . No energy 03/25/2016  . Hyperlipidemia 03/25/2016  . Ganglion of right wrist 03/25/2016  . Epicondylitis elbow, medial 02/02/2015  . Tendonitis 01/30/2015  . Elevated LDL cholesterol level 01/30/2015  . Heart burn 01/30/2015  . Tinnitus 01/30/2015     Plan:    1. Supervision of other normal pregnancy, antepartum - Welcomed back to practice and congratulations given  - Reviewed safety, visitor policy, reassurance about COVID-19 for pregnancy at this time. Discussed possible changes to visits, including televisits, that may occur due to COVID-19.  The office remains open if pt needs to be seen and  MAU is open 24 hours/day for OB emergencies. - Routine prenatal care  - Anticipatory guidance on upcoming appointments  - Obstetric panel - HIV antibody (with reflex) - Hepatitis C Antibody - Hemoglobinopathy Evaluation - Culture, OB Urine - GC/Chlamydia probe amp ([place])not at [facility] - Babyscripts Schedule Optimization - [place] bedside; Future - TSH  2. [redacted] weeks gestation of pregnancy - TSH completed today due to patients history of abnormal TSH, patient denies ever being on medication   Initial labs drawn. Continue prenatal vitamins. Problem list reviewed and updated. Ultrasound discussed; fetal anatomic survey: requested. Anticipatory guidance about prenatal visits given including labs, ultrasounds, and testing. Discussed usage of Babyscripts and virtual visits as additional source of managing and completing prenatal visits in midst of coronavirus and pandemic.   Encouraged to complete MyChart Registration for her ability to review results, send requests, and have questions addressed.  The nature of [facility]/Faculty Practice with multiple MDs and Advanced Practice Providers was explained to patient; also emphasized that residents, students are part of our team. Routine obstetric precautions reviewed. Encouraged to seek out care at office or emergency room [facility] MA[facility] preferred) for urgent and/or emergent concerns. Return in about 4 weeks (around 03/02/2021) for LROB, in person.     Sharyon Cable, CNM Center for [employer], [facility]

## 2021-02-04 LAB — OBSTETRIC PANEL
Absolute Monocytes: 1011 cells/uL — ABNORMAL HIGH (ref 200–950)
Antibody Screen: NOT DETECTED
Basophils Absolute: 38 cells/uL (ref 0–200)
Basophils Relative: 0.3 %
Eosinophils Absolute: 179 cells/uL (ref 15–500)
Eosinophils Relative: 1.4 %
HCT: 35.6 % (ref 35.0–45.0)
Hemoglobin: 11.4 g/dL — ABNORMAL LOW (ref 11.7–15.5)
Hepatitis B Surface Ag: NONREACTIVE
Lymphs Abs: 2790 cells/uL (ref 850–3900)
MCH: 24.7 pg — ABNORMAL LOW (ref 27.0–33.0)
MCHC: 32 g/dL (ref 32.0–36.0)
MCV: 77.2 fL — ABNORMAL LOW (ref 80.0–100.0)
MPV: 11.7 fL (ref 7.5–12.5)
Monocytes Relative: 7.9 %
Neutro Abs: 8781 cells/uL — ABNORMAL HIGH (ref 1500–7800)
Neutrophils Relative %: 68.6 %
Platelets: 296 10*3/uL (ref 140–400)
RBC: 4.61 10*6/uL (ref 3.80–5.10)
RDW: 17.1 % — ABNORMAL HIGH (ref 11.0–15.0)
RPR Ser Ql: NONREACTIVE
Rubella: 4.93 Index
Total Lymphocyte: 21.8 %
WBC: 12.8 10*3/uL — ABNORMAL HIGH (ref 3.8–10.8)

## 2021-02-04 LAB — HEPATITIS C ANTIBODY
Hepatitis C Ab: NONREACTIVE
SIGNAL TO CUT-OFF: 0 (ref <1.00)

## 2021-02-04 LAB — HEMOGLOBINOPATHY EVALUATION
Fetal Hemoglobin Testing: <1 % (ref 0.0–1.9)
HCT: 35.3 % (ref 35.0–45.0)
Hemoglobin A2 - HGBRFX: 2.1 % — ABNORMAL LOW (ref 2.2–3.2)
Hemoglobin: 11.2 g/dL — ABNORMAL LOW (ref 11.7–15.5)
Hgb A: 97.9 % (ref >96.0)
MCH: 24.8 pg — ABNORMAL LOW (ref 27.0–33.0)
MCV: 78.3 fL — ABNORMAL LOW (ref 80.0–100.0)
RBC: 4.51 10*6/uL (ref 3.80–5.10)
RDW: 17.8 % — ABNORMAL HIGH (ref 11.0–15.0)

## 2021-02-04 LAB — HIV ANTIBODY (ROUTINE TESTING W REFLEX): HIV 1&2 Ab, 4th Generation: NONREACTIVE

## 2021-02-04 LAB — CULTURE, OB URINE

## 2021-02-04 LAB — URINE CULTURE, OB REFLEX

## 2021-02-15 ENCOUNTER — Other Ambulatory Visit: Payer: Self-pay

## 2021-02-15 ENCOUNTER — Ambulatory Visit: Payer: 59

## 2021-02-24 ENCOUNTER — Encounter: Payer: Self-pay | Admitting: *Deleted

## 2021-02-24 DIAGNOSIS — Z3480 Encounter for supervision of other normal pregnancy, unspecified trimester: Secondary | ICD-10-CM

## 2021-03-01 ENCOUNTER — Encounter: Payer: Self-pay | Admitting: *Deleted

## 2021-03-01 DIAGNOSIS — Z3480 Encounter for supervision of other normal pregnancy, unspecified trimester: Secondary | ICD-10-CM

## 2021-03-02 ENCOUNTER — Ambulatory Visit (INDEPENDENT_AMBULATORY_CARE_PROVIDER_SITE_OTHER): Payer: 59 | Admitting: Advanced Practice Midwife

## 2021-03-02 ENCOUNTER — Other Ambulatory Visit: Payer: Self-pay

## 2021-03-02 VITALS — BP 110/66 | HR 95 | Wt 135.0 lb

## 2021-03-02 DIAGNOSIS — K5900 Constipation, unspecified: Secondary | ICD-10-CM

## 2021-03-02 DIAGNOSIS — Z3A19 19 weeks gestation of pregnancy: Secondary | ICD-10-CM

## 2021-03-02 DIAGNOSIS — O26899 Other specified pregnancy related conditions, unspecified trimester: Secondary | ICD-10-CM

## 2021-03-02 DIAGNOSIS — Z3480 Encounter for supervision of other normal pregnancy, unspecified trimester: Secondary | ICD-10-CM

## 2021-03-02 DIAGNOSIS — O99612 Diseases of the digestive system complicating pregnancy, second trimester: Secondary | ICD-10-CM

## 2021-03-02 DIAGNOSIS — Z3A14 14 weeks gestation of pregnancy: Secondary | ICD-10-CM

## 2021-03-02 DIAGNOSIS — O26892 Other specified pregnancy related conditions, second trimester: Secondary | ICD-10-CM

## 2021-03-02 DIAGNOSIS — R102 Pelvic and perineal pain: Secondary | ICD-10-CM

## 2021-03-02 DIAGNOSIS — R12 Heartburn: Secondary | ICD-10-CM

## 2021-03-02 NOTE — Progress Notes (Signed)
° °  PRENATAL VISIT NOTE  Subjective:  Olivia Gardner is a 30 y.o. G2P1001 at [redacted]w[redacted]d being seen today for ongoing prenatal care.  She is currently monitored for the following issues for this low-risk pregnancy and has Tendonitis; Elevated LDL cholesterol level; Tinnitus; Epicondylitis elbow, medial; Hyperlipidemia; Ganglion of right wrist; Vitamin D deficiency; Low iron stores; Hair loss; Post partum thyroiditis; Other dietary vitamin B12 deficiency anemia; Pica in adults; Elevated TSH; and Supervision of other normal pregnancy, antepartum on their problem list.  Patient reports heartburn and round ligament pain, some constipation.  Contractions: Not present. Vag. Bleeding: None.  Movement: Absent. Denies leaking of fluid.   The following portions of the patient's history were reviewed and updated as appropriate: allergies, current medications, past family history, past medical history, past social history, past surgical history and problem list.   Objective:   Vitals:   03/02/21 1549  BP: 110/66  Pulse: 95  Weight: 135 lb (61.2 kg)    Fetal Status: Fetal Heart Rate (bpm): 159   Movement: Absent     General:  Alert, oriented and cooperative. Patient is in no acute distress.  Skin: Skin is warm and dry. No rash noted.   Cardiovascular: Normal heart rate noted  Respiratory: Normal respiratory effort, no problems with respiration noted  Abdomen: Soft, gravid, appropriate for gestational age.  Pain/Pressure: Absent     Pelvic: Cervical exam deferred        Extremities: Normal range of motion.  Edema: None  Mental Status: Normal mood and affect. Normal behavior. Normal judgment and thought content.   Assessment and Plan:  Pregnancy: G2P1001 at [redacted]w[redacted]d 1. Supervision of other normal pregnancy, antepartum --Anticipatory guidance about next visits/weeks of pregnancy given. --Next visit in 5 weeks, after anatomy  US  - [place] MFM OB COMP + 14 WK; Future  2. [redacted] weeks gestation of  pregnancy   3. Heartburn during pregnancy in second trimester --Taking Tums, not helping much.  Discussed dietary changes.  Try Pepcid or Zantac.   4. Constipation during pregnancy in second trimester --Pt taking Colace and fiber supplement which help.  Add Miralax as needed.  5. Pain of round ligament affecting pregnancy, antepartum --Rest/ice/heat/warm bath/Tylenol/pregnancy support belt   Preterm labor symptoms and general obstetric precautions including but not limited to vaginal bleeding, contractions, leaking of fluid and fetal movement were reviewed in detail with the patient. Please refer to After Visit Summary for other counseling recommendations.   No follow-ups on file.  No future appointments.  Sharen Counter, CNM

## 2021-04-01 ENCOUNTER — Ambulatory Visit (INDEPENDENT_AMBULATORY_CARE_PROVIDER_SITE_OTHER): Payer: 59 | Admitting: Obstetrics & Gynecology

## 2021-04-01 ENCOUNTER — Other Ambulatory Visit: Payer: Self-pay

## 2021-04-01 VITALS — BP 104/61 | HR 81 | Wt 138.0 lb

## 2021-04-01 DIAGNOSIS — Z3A18 18 weeks gestation of pregnancy: Secondary | ICD-10-CM

## 2021-04-01 DIAGNOSIS — Z3480 Encounter for supervision of other normal pregnancy, unspecified trimester: Secondary | ICD-10-CM

## 2021-04-01 DIAGNOSIS — R030 Elevated blood-pressure reading, without diagnosis of hypertension: Secondary | ICD-10-CM

## 2021-04-01 NOTE — Progress Notes (Signed)
   PRENATAL VISIT NOTE  Subjective:  Olivia Gardner is a 30 y.o. G2P1001 at [redacted]w[redacted]d being seen today for ongoing prenatal care.  She is currently monitored for the following issues for this low-risk pregnancy and has Tendonitis; Elevated LDL cholesterol level; Tinnitus; Epicondylitis elbow, medial; Hyperlipidemia; Ganglion of right wrist; Vitamin D deficiency; Low iron stores; Hair loss; Post partum thyroiditis; Other dietary vitamin B12 deficiency anemia; Pica in adults; Elevated TSH; and Supervision of other normal pregnancy, antepartum on their problem list.  Patient reports no complaints.  Had a couple of elevated BP at home on Babyscripts, otherwise normal BP. No concerning symptoms.  Contractions: Not present. Vag. Bleeding: None.  Movement: Absent. Denies leaking of fluid.   The following portions of the patient's history were reviewed and updated as appropriate: allergies, current medications, past family history, past medical history, past social history, past surgical history and problem list.   Objective:   Vitals:   04/01/21 1541  BP: 104/61  Pulse: 81  Weight: 138 lb (62.6 kg)    Fetal Status: Fetal Heart Rate (bpm): 155   Movement: Absent     General:  Alert, oriented and cooperative. Patient is in no acute distress.  Skin: Skin is warm and dry. No rash noted.   Cardiovascular: Normal heart rate noted  Respiratory: Normal respiratory effort, no problems with respiration noted  Abdomen: Soft, gravid, appropriate for gestational age.  Pain/Pressure: Present     Pelvic: Cervical exam deferred        Extremities: Normal range of motion.  Edema: Trace  Mental Status: Normal mood and affect. Normal behavior. Normal judgment and thought content.   Assessment and Plan:  Pregnancy: G2P1001 at [redacted]w[redacted]d 1. Elevated blood pressure reading at home Reassured by normal BP at office today.  Told to continue taking BP weekly at home, no need to take more frequently for now.  Reassured by  normal BP on rechecks, likely error. Will check baseline labs today, in case it persists. - Comprehensive metabolic panel - CBC - Protein / creatinine ratio, urine  2. [redacted] weeks gestation of pregnancy 3. Supervision of other normal pregnancy, antepartum Low risk NIPS, AFP today. Scheduled for anatomy scan. - Alpha fetoprotein, maternal No other complaints or concerns.  Routine obstetric precautions reviewed.  Please refer to After Visit Summary for other counseling recommendations.   Return in about 4 weeks (around 04/29/2021) for OFFICE OB VISIT (MD only).  Future Appointments  Date Time Provider Department Center  04/05/2021  9:00 AM [facility] US1 [facility]  04/06/2021  9:30 AM Rolm Bookbinder, CNM [facility]  05/06/2021  3:45 PM Connor Foxworthy, Jethro Bastos, MD [facility]    Jaynie Collins, MD

## 2021-04-01 NOTE — Patient Instructions (Signed)

## 2021-04-02 LAB — ALPHA FETOPROTEIN, MATERNAL
AFP MoM: 1.28
AFP, Serum: 63.3 ng/mL
Calc'd Gestational Age: 18.4 weeks
Maternal Wt: 138 [lb_av]
Risk for ONTD: <1
Twins-AFP: 1

## 2021-04-02 LAB — PROTEIN / CREATININE RATIO, URINE
Creatinine, Urine: 22 mg/dL (ref 20–275)
Total Protein, Urine: <4 mg/dL — ABNORMAL LOW (ref 5–24)

## 2021-04-02 LAB — COMPREHENSIVE METABOLIC PANEL
AG Ratio: 1.3 (calc) (ref 1.0–2.5)
ALT: 7 U/L (ref 6–29)
AST: 14 U/L (ref 10–30)
Albumin: 3.9 g/dL (ref 3.6–5.1)
Alkaline phosphatase (APISO): 50 U/L (ref 31–125)
BUN/Creatinine Ratio: 22 (calc) (ref 6–22)
BUN: 10 mg/dL (ref 7–25)
CO2: 24 mmol/L (ref 20–32)
Calcium: 8.9 mg/dL (ref 8.6–10.2)
Chloride: 102 mmol/L (ref 98–110)
Creat: 0.45 mg/dL — ABNORMAL LOW (ref 0.50–1.10)
Globulin: 3 g/dL (calc) (ref 1.9–3.7)
Glucose, Bld: 92 mg/dL (ref 65–99)
Potassium: 4 mmol/L (ref 3.5–5.3)
Sodium: 136 mmol/L (ref 135–146)
Total Bilirubin: 0.4 mg/dL (ref 0.2–1.2)
Total Protein: 6.9 g/dL (ref 6.1–8.1)

## 2021-04-02 LAB — CBC
HCT: 34.3 % — ABNORMAL LOW (ref 35.0–45.0)
Hemoglobin: 11 g/dL — ABNORMAL LOW (ref 11.7–15.5)
MCH: 25 pg — ABNORMAL LOW (ref 27.0–33.0)
MCHC: 32.1 g/dL (ref 32.0–36.0)
MCV: 78 fL — ABNORMAL LOW (ref 80.0–100.0)
MPV: 11.9 fL (ref 7.5–12.5)
Platelets: 269 10*3/uL (ref 140–400)
RBC: 4.4 10*6/uL (ref 3.80–5.10)
RDW: 16.1 % — ABNORMAL HIGH (ref 11.0–15.0)
WBC: 12.2 10*3/uL — ABNORMAL HIGH (ref 3.8–10.8)

## 2021-04-05 ENCOUNTER — Other Ambulatory Visit: Payer: Self-pay | Admitting: *Deleted

## 2021-04-05 ENCOUNTER — Other Ambulatory Visit: Payer: Self-pay

## 2021-04-05 ENCOUNTER — Ambulatory Visit: Payer: 59 | Attending: Advanced Practice Midwife

## 2021-04-05 DIAGNOSIS — Z3A19 19 weeks gestation of pregnancy: Secondary | ICD-10-CM | POA: Diagnosis not present

## 2021-04-05 DIAGNOSIS — Z3480 Encounter for supervision of other normal pregnancy, unspecified trimester: Secondary | ICD-10-CM | POA: Insufficient documentation

## 2021-04-05 DIAGNOSIS — Z362 Encounter for other antenatal screening follow-up: Secondary | ICD-10-CM

## 2021-04-06 ENCOUNTER — Ambulatory Visit (INDEPENDENT_AMBULATORY_CARE_PROVIDER_SITE_OTHER): Payer: 59

## 2021-04-06 VITALS — BP 101/64 | HR 92

## 2021-04-06 DIAGNOSIS — R7989 Other specified abnormal findings of blood chemistry: Secondary | ICD-10-CM

## 2021-04-06 DIAGNOSIS — Z3480 Encounter for supervision of other normal pregnancy, unspecified trimester: Secondary | ICD-10-CM

## 2021-04-06 NOTE — Progress Notes (Signed)
   PRENATAL VISIT NOTE  Subjective:  Olivia Gardner is a 30 y.o. G2P1001 at [redacted]w[redacted]d being seen today for ongoing prenatal care.  She is currently monitored for the following issues for this low-risk pregnancy and has Tendonitis; Elevated LDL cholesterol level; Tinnitus; Epicondylitis elbow, medial; Hyperlipidemia; Ganglion of right wrist; Vitamin D deficiency; Low iron stores; Hair loss; Post partum thyroiditis; Other dietary vitamin B12 deficiency anemia; Pica in adults; Elevated TSH; and Supervision of other normal pregnancy, antepartum on their problem list.  Patient reports no complaints.  Contractions: Not present. Vag. Bleeding: None.  Movement: Present. Denies leaking of fluid.   The following portions of the patient's history were reviewed and updated as appropriate: allergies, current medications, past family history, past medical history, past social history, past surgical history and problem list.   Objective:   Vitals:   04/06/21 0932  BP: 101/64  Pulse: 92    Fetal Status: Fetal Heart Rate (bpm): 146   Movement: Present     General:  Alert, oriented and cooperative. Patient is in no acute distress.  Skin: Skin is warm and dry. No rash noted.   Cardiovascular: Normal heart rate noted  Respiratory: Normal respiratory effort, no problems with respiration noted  Abdomen: Soft, gravid, appropriate for gestational age.  Pain/Pressure: Absent     Pelvic: Cervical exam deferred        Extremities: Normal range of motion.  Edema: Trace  Mental Status: Normal mood and affect. Normal behavior. Normal judgment and thought content.   Assessment and Plan:  Pregnancy: G2P1001 at [redacted]w[redacted]d 1. Supervision of other normal pregnancy, antepartum -No complaints, routine care -Discussed safe medications in pregnancy and list given. -Instructed to bring BP cuff to next visit to compare with office cuff to ensure accurate readings at home  2. Elevated TSH -Repeat q trimester. Will follow up as  needed.  Preterm labor symptoms and general obstetric precautions including but not limited to vaginal bleeding, contractions, leaking of fluid and fetal movement were reviewed in detail with the patient. Please refer to After Visit Summary for other counseling recommendations.   Return in about 4 weeks (around 05/04/2021) for Return OB visit.  Future Appointments  Date Time Provider Department Center  05/06/2021  3:45 PM Tereso Newcomer, MD [facility]  05/17/2021  2:45 PM [facility] NURSE [facility]  05/17/2021  3:00 PM [facility] US1 [facility]    Rolm Bookbinder, CNM

## 2021-04-06 NOTE — Patient Instructions (Signed)

## 2021-04-07 LAB — TSH: TSH: 2.32 mIU/L

## 2021-04-30 ENCOUNTER — Telehealth: Payer: Self-pay | Admitting: *Deleted

## 2021-04-30 ENCOUNTER — Other Ambulatory Visit: Payer: Self-pay

## 2021-04-30 ENCOUNTER — Emergency Department
Admission: RE | Admit: 2021-04-30 | Discharge: 2021-04-30 | Disposition: A | Discharge status: Home / Self Care | Payer: 59 | Source: Ambulatory Visit | Attending: Family Medicine | Admitting: Family Medicine

## 2021-04-30 VITALS — BP 111/73 | HR 84 | Temp 98.5°F | Resp 16 | Ht 63.0 in | Wt 140.0 lb

## 2021-04-30 DIAGNOSIS — J069 Acute upper respiratory infection, unspecified: Secondary | ICD-10-CM | POA: Diagnosis not present

## 2021-04-30 DIAGNOSIS — J0100 Acute maxillary sinusitis, unspecified: Secondary | ICD-10-CM

## 2021-04-30 MED ORDER — AMOXICILLIN 875 MG PO TABS: 875.0000 mg | ORAL_TABLET | Freq: Two times a day (BID) | ORAL | 0 refills | Status: DC

## 2021-04-30 NOTE — Telephone Encounter (Signed)
Patient wanted to know what type of medication she can take for nasal congestion. Patient advised to take Mucinex-D. Patient agreed.

## 2021-04-30 NOTE — ED Triage Notes (Signed)
Sinus pressure & congestion x 1 week  Mucinex D w/ min relief per her OB  Pt has an OB appt on Monday  Now has dental pain from sinus pressure  [employer] - booster 12/21

## 2021-04-30 NOTE — ED Provider Notes (Signed)
[facility]    CSN: [id number] Arrival date & time: 04/30/21  1654      History   Chief Complaint Chief Complaint  Patient presents with  . Facial Pain  . Nasal Congestion  . Appointment    HPI Olivia Gardner is a 30 y.o. female.   Patient is pregnant, with seasonal rhinitis, reporting that she had a significant increase in nasal congestion about a week ago now not responding to Zyrtec during the past three days.  Yesterday she developed myalgias, chills, and a cough worse at night.  She also complains of left facial pain with aching in her left upper teeth.  She has had no pregnancy complications, scheduled to visit her OB in three days.  The history is provided by the patient.    Past Medical History:  Diagnosis Date  . Abnormal weight gain 06/25/2019  . Hyperlipidemia   . Vitamin D deficiency     Patient Active Problem List   Diagnosis Date Noted  . Supervision of other normal pregnancy, antepartum 02/03/2021  . Pica in adults 07/31/2020  . Elevated TSH 07/31/2020  . Other dietary vitamin B12 deficiency anemia 09/24/2019  . Post partum thyroiditis 06/26/2019  . Hair loss 06/25/2019  . Vitamin D deficiency 03/28/2016  . Low iron stores 03/28/2016  . Hyperlipidemia 03/25/2016  . Ganglion of right wrist 03/25/2016  . Epicondylitis elbow, medial 02/02/2015  . Tendonitis 01/30/2015  . Elevated LDL cholesterol level 01/30/2015  . Tinnitus 01/30/2015    Past Surgical History:  Procedure Laterality Date  . WISDOM TOOTH EXTRACTION      OB History    Gravida  2   Para  1   Term  1   Preterm      AB      Living  1     SAB      IAB      Ectopic      Multiple  0   Live Births  1            Home Medications    Prior to Admission medications   Medication Sig Start Date End Date Taking? Authorizing Provider  amoxicillin (AMOXIL) 875 MG tablet Take 1 tablet (875 mg total) by mouth 2 (two) times daily. 04/30/21  Yes Lattie Haw, MD  cetirizine (ZYRTEC) 10 MG tablet Take 10 mg by mouth daily.   Yes [provider]  docusate sodium (COLACE) 100 MG capsule Take 100 mg by mouth 2 (two) times daily.   Yes [provider]  Prenat-Fe Carbonyl-FA-Omega 3 (ONE-A-DAY WOMENS PRENATAL 1) 28-0.8-235 MG CAPS  07/26/20  Yes [provider]    Family History Family History  Problem Relation Age of Onset  . Hyperlipidemia Mother   . Hypertension Father   . Diabetes Maternal Grandmother   . Diabetes Paternal Grandmother   . Asthma Neg Hx   . Stroke Neg Hx     Social History Social History   Tobacco Use  . Smoking status: Never Smoker  . Smokeless tobacco: Never Used  Vaping Use  . Vaping Use: Never used  Substance Use Topics  . Alcohol use: Not Currently    Alcohol/week: 0.0 standard drinks  . Drug use: No     Allergies   Sulfacetamide sodium   Review of Systems Review of Systems No sore throat + cough No pleuritic pain No wheezing + nasal congestion + post-nasal drainage + sinus pain/pressure No itchy/red eyes No earache  No hemoptysis No SOB No fever, + chills No nausea No vomiting No abdominal/pelvic pain No vaginal bleeding No diarrhea No urinary symptoms No skin rash + fatigue + myalgias + headache Used OTC meds (Zyrtec) without relief   Physical Exam Triage Vital Signs ED Triage Vitals  Enc Vitals Group     BP 04/30/21 1730 111/73     Pulse Rate 04/30/21 1730 84     Resp 04/30/21 1730 16     Temp 04/30/21 1730 98.5 F (36.9 C)     Temp Source 04/30/21 1730 Oral     SpO2 04/30/21 1730 100 %     Weight 04/30/21 1733 140 lb (63.5 kg)     Height 04/30/21 1733 5\' 3"  (1.6 m)     Head Circumference --      Peak Flow --      Pain Score 04/30/21 1731 7     Pain Loc --      Pain Edu? --      Excl. in GC? --    No data found.  Updated Vital Signs BP 111/73 (BP Location: Right Arm)   Pulse 84   Temp 98.5 F (36.9 C) (Oral)   Resp 16   Ht 5\' 3"  (1.6  m)   Wt 63.5 kg   LMP 11/23/2020   SpO2 100%   BMI 24.80 kg/m   Visual Acuity Right Eye Distance:   Left Eye Distance:   Bilateral Distance:    Right Eye Near:   Left Eye Near:    Bilateral Near:     Physical Exam Nursing notes and Vital Signs reviewed. Appearance:  Patient appears stated age, and in no acute distress Eyes:  Pupils are equal, round, and reactive to light and accomodation.  Extraocular movement is intact.  Conjunctivae are not inflamed  Ears:  Canals normal.  Tympanic membranes normal.  Nose:  Congested turbinates.  Left maxillary sinus tenderness is present.  Pharynx:  Normal Neck:  Supple.  Mildly enlarged lateral nodes are present, tender to palpation on the left.   Lungs:  Clear to auscultation.  Breath sounds are equal.  Moving air well. Heart:  Regular rate and rhythm without murmurs, rubs, or gallops.  Abdomen:  Gravid, nontender without masses or hepatosplenomegaly.  Bowel sounds are present.  No CVA or flank tenderness.  Extremities:  No edema.  Skin:  No rash present.   UC Treatments / Results  Labs (all labs ordered are listed, but only abnormal results are displayed) Labs Reviewed  COVID-19, FLU A+B NAA    EKG   Radiology No results found.  Procedures Procedures (including critical care time)  Medications Ordered in UC Medications - No data to display  Initial Impression / Assessment and Plan / UC Course  I have reviewed the triage vital signs and the nursing notes.  Pertinent labs & imaging results that were available during my care of the patient were reviewed by me and considered in my medical decision making (see chart for details).    COVID19 PCR and Flu A/B pending. Begin amoxicillin. Followup with OB as scheduled.   Final Clinical Impressions(s) / UC Diagnoses   Final diagnoses:  Viral URI with cough  Acute maxillary sinusitis, recurrence not specified     Discharge Instructions     Take Mucinex D for congestion,  OR May take plain guaifenesin (1200mg  extended release tabs such as Mucinex) twice daily, with plenty of water, and add Pseudoephedrine (30mg , one or two every 4  to 6 hours). Get adequate rest.   Recommend using saline nasal spray several times daily and saline nasal irrigation (AYR is a common brand).   Try warm salt water gargles for sore throat.  Stop all antihistamines (Zyrtec, etc) for now, and other non-prescription cough/cold preparations. May take Tylenol as needed for pain. May take Delsym Cough Suppressant ("12 Hour Cough Relief") at bedtime for nighttime cough.   If your COVID-19 test is positive, isolate yourself for five days from the date of testing.  At the end of five days you may end isolation if your symptoms have cleared or improved, and you have not had a fever for 24 hours. At this time you should wear a mask for five more days when you are around others.   If symptoms become significantly worse during the night or over the weekend, proceed to the local emergency room.         ED Prescriptions    Medication Sig Dispense Auth. Provider   amoxicillin (AMOXIL) 875 MG tablet Take 1 tablet (875 mg total) by mouth 2 (two) times daily. 14 tablet Lattie Haw, MD        Lattie Haw, MD 05/01/21 [phone]

## 2021-04-30 NOTE — Discharge Instructions (Addendum)
Take Mucinex D for congestion, OR May take plain guaifenesin (1200mg  extended release tabs such as Mucinex) twice daily, with plenty of water, and add Pseudoephedrine (30mg , one or two every 4 to 6 hours). Get adequate rest.   Recommend using saline nasal spray several times daily and saline nasal irrigation (AYR is a common brand).   Try warm salt water gargles for sore throat.  Stop all antihistamines (Zyrtec, etc) for now, and other non-prescription cough/cold preparations. May take Tylenol as needed for pain. May take Delsym Cough Suppressant ("12 Hour Cough Relief") at bedtime for nighttime cough.   If your COVID-19 test is positive, isolate yourself for five days from the date of testing.  At the end of five days you may end isolation if your symptoms have cleared or improved, and you have not had a fever for 24 hours. At this time you should wear a mask for five more days when you are around others.   If symptoms become significantly worse during the night or over the weekend, proceed to the local emergency room.

## 2021-05-03 ENCOUNTER — Other Ambulatory Visit: Payer: Self-pay

## 2021-05-03 ENCOUNTER — Ambulatory Visit (INDEPENDENT_AMBULATORY_CARE_PROVIDER_SITE_OTHER): Payer: 59 | Admitting: Obstetrics & Gynecology

## 2021-05-03 VITALS — BP 113/76 | HR 92 | Wt 142.0 lb

## 2021-05-03 DIAGNOSIS — E559 Vitamin D deficiency, unspecified: Secondary | ICD-10-CM

## 2021-05-03 DIAGNOSIS — Z3A23 23 weeks gestation of pregnancy: Secondary | ICD-10-CM

## 2021-05-03 DIAGNOSIS — Z3480 Encounter for supervision of other normal pregnancy, unspecified trimester: Secondary | ICD-10-CM

## 2021-05-03 LAB — COVID-19, FLU A+B NAA
Influenza A, NAA: NOT DETECTED
Influenza B, NAA: NOT DETECTED
SARS-CoV-2, NAA: NOT DETECTED

## 2021-05-03 NOTE — Addendum Note (Signed)
Addended by: Kathie Dike on: 05/03/2021 04:46 PM   Modules accepted: Orders

## 2021-05-03 NOTE — Progress Notes (Signed)
   PRENATAL VISIT NOTE  Subjective:  Olivia Gardner is a 30 y.o. G2P1001 at [redacted]w[redacted]d being seen today for ongoing prenatal care.  She is currently monitored for the following issues for this low-risk pregnancy and has Tendonitis; Elevated LDL cholesterol level; Tinnitus; Epicondylitis elbow, medial; Hyperlipidemia; Ganglion of right wrist; Vitamin D deficiency; Low iron stores; Post partum thyroiditis; Other dietary vitamin B12 deficiency anemia; Pica in adults; Elevated TSH; and Supervision of other normal pregnancy, antepartum on their problem list.  Patient reports no complaints.   .  .   . Denies leaking of fluid.   The following portions of the patient's history were reviewed and updated as appropriate: allergies, current medications, past family history, past medical history, past social history, past surgical history and problem list.   Objective:   Vitals:   05/03/21 1621  BP: 113/76  Pulse: 92  Weight: 142 lb (64.4 kg)    Fetal Status:           General:  Alert, oriented and cooperative. Patient is in no acute distress.  Skin: Skin is warm and dry. No rash noted.   Cardiovascular: Normal heart rate noted  Respiratory: Normal respiratory effort, no problems with respiration noted  Abdomen: Soft, gravid, appropriate for gestational age.        Pelvic: Cervical exam deferred        Extremities: Normal range of motion.     Mental Status: Normal mood and affect. Normal behavior. Normal judgment and thought content.   Assessment and Plan:  Pregnancy: G2P1001 at [redacted]w[redacted]d 1. [redacted] weeks gestation of pregnancy BPs nml on Baby Rx  2. Supervision of other normal pregnancy, antepartum -Has f/u US in 2 weeks  Preterm labor symptoms and general obstetric precautions including but not limited to vaginal bleeding, contractions, leaking of fluid and fetal movement were reviewed in detail with the patient. Please refer to After Visit Summary for other counseling recommendations.   No  follow-ups on file.  Future Appointments  Date Time Provider Department Center  05/21/2021  2:30 PM [facility] NURSE [facility]  05/21/2021  2:45 PM [facility] US5 [facility]    [name], MD

## 2021-05-03 NOTE — Addendum Note (Signed)
Addended by: Kathie Dike on: 05/03/2021 04:48 PM   Modules accepted: Orders

## 2021-05-06 ENCOUNTER — Encounter: Payer: 59 | Admitting: Obstetrics & Gynecology

## 2021-05-17 ENCOUNTER — Other Ambulatory Visit: Payer: Self-pay

## 2021-05-17 ENCOUNTER — Ambulatory Visit: Payer: 59

## 2021-05-17 ENCOUNTER — Other Ambulatory Visit (HOSPITAL_COMMUNITY)
Admission: RE | Admit: 2021-05-17 | Discharge: 2021-05-17 | Disposition: A | Discharge status: Home / Self Care | Payer: 59 | Source: Ambulatory Visit | Attending: Obstetrics & Gynecology | Admitting: Obstetrics & Gynecology

## 2021-05-17 ENCOUNTER — Ambulatory Visit (INDEPENDENT_AMBULATORY_CARE_PROVIDER_SITE_OTHER): Payer: 59 | Admitting: Obstetrics & Gynecology

## 2021-05-17 VITALS — BP 117/72 | HR 109 | Wt 141.0 lb

## 2021-05-17 DIAGNOSIS — M545 Low back pain, unspecified: Secondary | ICD-10-CM | POA: Insufficient documentation

## 2021-05-17 DIAGNOSIS — Z3480 Encounter for supervision of other normal pregnancy, unspecified trimester: Secondary | ICD-10-CM

## 2021-05-17 DIAGNOSIS — E559 Vitamin D deficiency, unspecified: Secondary | ICD-10-CM

## 2021-05-17 DIAGNOSIS — O26899 Other specified pregnancy related conditions, unspecified trimester: Secondary | ICD-10-CM | POA: Insufficient documentation

## 2021-05-17 DIAGNOSIS — Z3A25 25 weeks gestation of pregnancy: Secondary | ICD-10-CM

## 2021-05-17 DIAGNOSIS — R102 Pelvic and perineal pain: Secondary | ICD-10-CM

## 2021-05-17 LAB — POCT URINALYSIS DIPSTICK
Appearance: NORMAL
Blood, UA: NEGATIVE
Glucose, UA: NEGATIVE
Leukocytes, UA: NEGATIVE
Nitrite, UA: NEGATIVE
Protein, UA: NEGATIVE
Spec Grav, UA: 1.015 (ref 1.010–1.025)
Urobilinogen, UA: NEGATIVE E.U./dL — AB
pH, UA: 7 (ref 5.0–8.0)

## 2021-05-17 NOTE — Progress Notes (Signed)
Having pulse like Left pelvic pain.    PRENATAL VISIT NOTE  Subjective:  Olivia Gardner is a 30 y.o. G2P1001 at [redacted]w[redacted]d being seen today for ongoing prenatal care.  She is currently monitored for the following issues for this low-risk pregnancy and has Tendonitis; Elevated LDL cholesterol level; Tinnitus; Epicondylitis elbow, medial; Hyperlipidemia; Ganglion of right wrist; Vitamin D deficiency; Low iron stores; Post partum thyroiditis; Other dietary vitamin B12 deficiency anemia; Pica in adults; Elevated TSH; Supervision of other normal pregnancy, antepartum; and Pelvic pain affecting pregnancy on their problem list.  Patient reports rhythmic pelvic pain going from pubic symphysis and cephalad..  Contractions: Not present. Vag. Bleeding: None.  Movement: Present. Denies leaking of fluid. Pt does have urine leakage which has been most of pregnancy.  She does not believe the fulid to be coming from her vagina.    The following portions of the patient's history were reviewed and updated as appropriate: allergies, current medications, past family history, past medical history, past social history, past surgical history and problem list.   Objective:   Vitals:   05/17/21 0805  BP: 117/72  Pulse: (!) 109  Weight: 141 lb (64 kg)    Fetal Status: Fetal Heart Rate (bpm): 152   Movement: Present     General:  Alert, oriented and cooperative. Patient is in no acute distress.  Skin: Skin is warm and dry. No rash noted.   Cardiovascular: Normal heart rate noted  Respiratory: Normal respiratory effort, no problems with respiration noted  Abdomen: Soft, gravid, appropriate for gestational age.  Pain/Pressure: Present     Pelvic: Cervical exam performed in the presence of a chaperone      External os is open 1 cm; cervix is long and internal os is closed.  Cervix is posterior with tone. No   Extremities: Normal range of motion.  Edema: Trace  Mental Status: Normal mood and affect. Normal behavior.  Normal judgment and thought content.   Assessment and Plan:  Pregnancy: G2P1001 at [redacted]w[redacted]d 1. Low back pain, unspecified back pain laterality, unspecified chronicity, unspecified whether sciatica present - Cont. Chiropractor visits as needed - POCT Urinalysis Dipstick - Fetal fibronectin - Cervicovaginal ancillary only( [place]) - Culture, OB Urine - Urinalysis, Routine w reflex microscopic  2. [redacted] weeks gestation of pregnancy  3. Vitamin D deficiency Labs to be drawn at 28 weeks  4. Supervision of other normal pregnancy, antepartum  5. Pelvic pain affecting pregnancy, antepartum - POCT Urinalysis Dipstick - Fetal fibronectin - Cervicovaginal ancillary only( ) - Culture, OB Urine - Urinalysis, Routine w reflex microscopic  Pelvic pain does not seem to be preterm labor.  Urine is clear.  Will send for culture.  Fetal fibronectin also sent.  Cervix has closed internal os, long, posterior with tone.  6.  Covid Symptoms Patient has cough sore throat and body aches.  Patient encouraged to take a COVID test.  Pt has been vaccinated and boosted.    Preterm labor symptoms and general obstetric precautions including but not limited to vaginal bleeding, contractions, leaking of fluid and fetal movement were reviewed in detail with the patient. Please refer to After Visit Summary for other counseling recommendations.   Return in about 3 weeks (around 06/07/2021).  Future Appointments  Date Time Provider Department Center  05/21/2021  2:30 PM [facility] NURSE [facility]  05/21/2021  2:45 PM [facility] US[facility]  06/16/2021  9:10 AM Rasch, Harolyn Rutherford, NP [facility]    Tresa Endo  Gala Romney, MD

## 2021-05-18 LAB — URINALYSIS, ROUTINE W REFLEX MICROSCOPIC

## 2021-05-18 LAB — CERVICOVAGINAL ANCILLARY ONLY
Bacterial Vaginitis (gardnerella): POSITIVE — AB
Chlamydia: NEGATIVE
Comment: NEGATIVE
Comment: NEGATIVE
Comment: NEGATIVE
Comment: NORMAL
Neisseria Gonorrhea: NEGATIVE
Trichomonas: NEGATIVE

## 2021-05-18 LAB — FETAL FIBRONECTIN: Fetal Fibronectin: NEGATIVE

## 2021-05-19 ENCOUNTER — Telehealth: Payer: Self-pay | Admitting: *Deleted

## 2021-05-19 LAB — CULTURE, OB URINE

## 2021-05-19 LAB — URINE CULTURE, OB REFLEX

## 2021-05-19 MED ORDER — METRONIDAZOLE 500 MG PO TABS: 500.0000 mg | ORAL_TABLET | Freq: Two times a day (BID) | ORAL | 0 refills | Status: DC

## 2021-05-19 NOTE — Telephone Encounter (Signed)
Pt notified of positive BV and RX for Flagyl 500mg  sent to [facility] per protocol.

## 2021-05-21 ENCOUNTER — Encounter: Payer: Self-pay | Admitting: *Deleted

## 2021-05-21 ENCOUNTER — Other Ambulatory Visit: Payer: Self-pay

## 2021-05-21 ENCOUNTER — Other Ambulatory Visit: Payer: Self-pay | Admitting: Obstetrics

## 2021-05-21 ENCOUNTER — Ambulatory Visit: Payer: 59 | Attending: Maternal & Fetal Medicine | Admitting: *Deleted

## 2021-05-21 ENCOUNTER — Ambulatory Visit (HOSPITAL_BASED_OUTPATIENT_CLINIC_OR_DEPARTMENT_OTHER): Payer: 59

## 2021-05-21 DIAGNOSIS — Z362 Encounter for other antenatal screening follow-up: Secondary | ICD-10-CM

## 2021-05-21 DIAGNOSIS — O365930 Maternal care for other known or suspected poor fetal growth, third trimester, not applicable or unspecified: Secondary | ICD-10-CM | POA: Insufficient documentation

## 2021-05-21 DIAGNOSIS — Z3A25 25 weeks gestation of pregnancy: Secondary | ICD-10-CM | POA: Diagnosis not present

## 2021-05-21 DIAGNOSIS — Z363 Encounter for antenatal screening for malformations: Secondary | ICD-10-CM | POA: Diagnosis not present

## 2021-05-21 DIAGNOSIS — O365920 Maternal care for other known or suspected poor fetal growth, second trimester, not applicable or unspecified: Secondary | ICD-10-CM

## 2021-05-21 DIAGNOSIS — O2690 Pregnancy related conditions, unspecified, unspecified trimester: Secondary | ICD-10-CM

## 2021-05-21 DIAGNOSIS — Z3480 Encounter for supervision of other normal pregnancy, unspecified trimester: Secondary | ICD-10-CM

## 2021-06-07 ENCOUNTER — Other Ambulatory Visit: Payer: Self-pay | Admitting: Advanced Practice Midwife

## 2021-06-07 DIAGNOSIS — Z3A28 28 weeks gestation of pregnancy: Secondary | ICD-10-CM

## 2021-06-07 NOTE — Progress Notes (Unsigned)
Pt sent MyChart message requesting Vitamin D be added to scheduled 28 week labs, which include 2 hour GTT, CBC, and TSH due to previous thyroiditis.  Labs orders placed.

## 2021-06-09 LAB — VITAMIN D 25 HYDROXY (VIT D DEFICIENCY, FRACTURES): Vit D, 25-Hydroxy: 19 ng/mL — ABNORMAL LOW (ref 30–100)

## 2021-06-09 LAB — CBC
HCT: 30.8 % — ABNORMAL LOW (ref 35.0–45.0)
Hemoglobin: 9.6 g/dL — ABNORMAL LOW (ref 11.7–15.5)
MCH: 24 pg — ABNORMAL LOW (ref 27.0–33.0)
MCHC: 31.2 g/dL — ABNORMAL LOW (ref 32.0–36.0)
MCV: 77 fL — ABNORMAL LOW (ref 80.0–100.0)
MPV: 11.2 fL (ref 7.5–12.5)
Platelets: 286 10*3/uL (ref 140–400)
RBC: 4 10*6/uL (ref 3.80–5.10)
RDW: 15.4 % — ABNORMAL HIGH (ref 11.0–15.0)
WBC: 11.5 10*3/uL — ABNORMAL HIGH (ref 3.8–10.8)

## 2021-06-09 LAB — GLUCOSE TOLERANCE, 2 HOURS W/ 1HR
Glucose, 1 hour: 133 mg/dL (ref 65–199)
Glucose, 2 hour: 109 mg/dL (ref 65–139)
Glucose, Fasting: 65 mg/dL (ref 65–99)

## 2021-06-09 LAB — TSH: TSH: 2.18 mIU/L

## 2021-06-09 LAB — RPR: RPR Ser Ql: NONREACTIVE

## 2021-06-09 LAB — HIV ANTIBODY (ROUTINE TESTING W REFLEX): HIV 1&2 Ab, 4th Generation: NONREACTIVE

## 2021-06-11 ENCOUNTER — Other Ambulatory Visit: Payer: Self-pay | Admitting: Advanced Practice Midwife

## 2021-06-11 MED ORDER — FERROUS SULFATE 325 (65 FE) MG PO TABS: 325.0000 mg | ORAL_TABLET | ORAL | 2 refills | Status: DC

## 2021-06-11 NOTE — Progress Notes (Unsigned)
Order placed for ferrous sulfate, pt instructed to take OTC Vitamin D3 daily.  Will follow up PRN in office.

## 2021-06-16 ENCOUNTER — Ambulatory Visit (INDEPENDENT_AMBULATORY_CARE_PROVIDER_SITE_OTHER): Payer: 59 | Admitting: Obstetrics and Gynecology

## 2021-06-16 ENCOUNTER — Other Ambulatory Visit: Payer: Self-pay

## 2021-06-16 DIAGNOSIS — Z3A29 29 weeks gestation of pregnancy: Secondary | ICD-10-CM

## 2021-06-16 DIAGNOSIS — Z23 Encounter for immunization: Secondary | ICD-10-CM

## 2021-06-16 DIAGNOSIS — Z3480 Encounter for supervision of other normal pregnancy, unspecified trimester: Secondary | ICD-10-CM

## 2021-06-16 NOTE — Progress Notes (Signed)
   PRENATAL VISIT NOTE  Subjective:  Olivia Gardner is a 30 y.o. G2P1001 at [redacted]w[redacted]d being seen today for ongoing prenatal care.  She is currently monitored for the following issues for this low-risk pregnancy and has Tendonitis; Elevated LDL cholesterol level; Tinnitus; Epicondylitis elbow, medial; Hyperlipidemia; Ganglion of right wrist; Vitamin D deficiency; Low iron stores; Post partum thyroiditis; Other dietary vitamin B12 deficiency anemia; Pica in adults; Elevated TSH; Supervision of other normal pregnancy, antepartum; and Pelvic pain affecting pregnancy on their problem list.  Patient reports no complaints.  Contractions: Not present. Vag. Bleeding: None.  Movement: Present. Denies leaking of fluid.   The following portions of the patient's history were reviewed and updated as appropriate: allergies, current medications, past family history, past medical history, past social history, past surgical history and problem list.   Objective:   Vitals:   06/16/21 0908  BP: 110/66  Pulse: 88  Weight: 143 lb (64.9 kg)    Fetal Status: Fetal Heart Rate (bpm): 135 Fundal Height: 28 cm Movement: Present     General:  Alert, oriented and cooperative. Patient is in no acute distress.  Skin: Skin is warm and dry. No rash noted.   Cardiovascular: Normal heart rate noted  Respiratory: Normal respiratory effort, no problems with respiration noted  Abdomen: Soft, gravid, appropriate for gestational age.  Pain/Pressure: Absent     Pelvic: Cervical exam deferred        Extremities: Normal range of motion.  Edema: Trace  Mental Status: Normal mood and affect. Normal behavior. Normal judgment and thought content.   Assessment and Plan:  Pregnancy: G2P1001 at [redacted]w[redacted]d 1. Supervision of other normal pregnancy, antepartum  - 2 hour GTT normal.  - small for gestational age, continue MFM [place] for growth [place]- reassured her that this is not her fault that baby is small.    Preterm labor symptoms and  general obstetric precautions including but not limited to vaginal bleeding, contractions, leaking of fluid and fetal movement were reviewed in detail with the patient. Please refer to After Visit Summary for other counseling recommendations.   Return in about 2 weeks (around 06/30/2021).  Future Appointments  Date Time Provider Department Center  06/22/2021 10:45 AM [facility] NURSE [facility]  06/22/2021 11:00 AM [facility] US1 [facility]  06/29/2021  9:30 AM Raelyn Mora, CNM [facility]    Venia Carbon, NP

## 2021-06-22 ENCOUNTER — Ambulatory Visit: Payer: 59 | Admitting: *Deleted

## 2021-06-22 ENCOUNTER — Ambulatory Visit: Payer: 59 | Attending: Obstetrics and Gynecology

## 2021-06-22 ENCOUNTER — Other Ambulatory Visit: Payer: Self-pay

## 2021-06-22 ENCOUNTER — Other Ambulatory Visit: Payer: Self-pay | Admitting: Obstetrics

## 2021-06-22 ENCOUNTER — Encounter: Payer: Self-pay | Admitting: *Deleted

## 2021-06-22 ENCOUNTER — Other Ambulatory Visit: Payer: Self-pay | Admitting: *Deleted

## 2021-06-22 VITALS — BP 111/69 | HR 69

## 2021-06-22 DIAGNOSIS — Z362 Encounter for other antenatal screening follow-up: Secondary | ICD-10-CM | POA: Diagnosis not present

## 2021-06-22 DIAGNOSIS — O365930 Maternal care for other known or suspected poor fetal growth, third trimester, not applicable or unspecified: Secondary | ICD-10-CM | POA: Diagnosis not present

## 2021-06-22 DIAGNOSIS — O365920 Maternal care for other known or suspected poor fetal growth, second trimester, not applicable or unspecified: Secondary | ICD-10-CM | POA: Insufficient documentation

## 2021-06-22 DIAGNOSIS — O2690 Pregnancy related conditions, unspecified, unspecified trimester: Secondary | ICD-10-CM | POA: Insufficient documentation

## 2021-06-22 DIAGNOSIS — O321XX0 Maternal care for breech presentation, not applicable or unspecified: Secondary | ICD-10-CM

## 2021-06-22 DIAGNOSIS — Z3480 Encounter for supervision of other normal pregnancy, unspecified trimester: Secondary | ICD-10-CM

## 2021-06-22 DIAGNOSIS — Z3A30 30 weeks gestation of pregnancy: Secondary | ICD-10-CM

## 2021-06-29 ENCOUNTER — Other Ambulatory Visit: Payer: Self-pay

## 2021-06-29 ENCOUNTER — Encounter: Payer: Self-pay | Admitting: *Deleted

## 2021-06-29 ENCOUNTER — Ambulatory Visit: Payer: 59 | Attending: Obstetrics

## 2021-06-29 ENCOUNTER — Encounter: Payer: 59 | Admitting: Obstetrics and Gynecology

## 2021-06-29 ENCOUNTER — Ambulatory Visit (HOSPITAL_BASED_OUTPATIENT_CLINIC_OR_DEPARTMENT_OTHER): Payer: 59 | Admitting: *Deleted

## 2021-06-29 ENCOUNTER — Ambulatory Visit: Payer: 59 | Admitting: *Deleted

## 2021-06-29 VITALS — BP 114/70 | HR 80

## 2021-06-29 DIAGNOSIS — O365930 Maternal care for other known or suspected poor fetal growth, third trimester, not applicable or unspecified: Secondary | ICD-10-CM | POA: Insufficient documentation

## 2021-06-29 DIAGNOSIS — Z3A31 31 weeks gestation of pregnancy: Secondary | ICD-10-CM

## 2021-06-29 DIAGNOSIS — O365990 Maternal care for other known or suspected poor fetal growth, unspecified trimester, not applicable or unspecified: Secondary | ICD-10-CM | POA: Diagnosis not present

## 2021-06-29 DIAGNOSIS — Z3480 Encounter for supervision of other normal pregnancy, unspecified trimester: Secondary | ICD-10-CM

## 2021-06-29 DIAGNOSIS — Z362 Encounter for other antenatal screening follow-up: Secondary | ICD-10-CM | POA: Diagnosis not present

## 2021-06-29 NOTE — Procedures (Signed)
Olivia Gardner [birth year] [redacted]w[redacted]d  Fetus A Non-Stress Test Interpretation for 06/29/21  Indication: IUGR  Fetal Heart Rate A Mode: External Baseline Rate (A): 135 bpm Variability: Moderate Accelerations: 15 x 15 Decelerations: None Multiple birth?: No  Uterine Activity Mode: Palpation, Toco Contraction Frequency (min): Rare Contraction Quality: Mild Resting Tone Palpated: Relaxed Resting Time: Adequate  Interpretation (Fetal Testing) Nonstress Test Interpretation: Reactive Comments: Dr. Parke Poisson reviewed tracing.

## 2021-06-30 ENCOUNTER — Ambulatory Visit (INDEPENDENT_AMBULATORY_CARE_PROVIDER_SITE_OTHER): Payer: 59 | Admitting: Obstetrics & Gynecology

## 2021-06-30 VITALS — BP 109/71 | HR 93 | Wt 145.0 lb

## 2021-06-30 DIAGNOSIS — D649 Anemia, unspecified: Secondary | ICD-10-CM

## 2021-06-30 DIAGNOSIS — Z3A31 31 weeks gestation of pregnancy: Secondary | ICD-10-CM

## 2021-06-30 DIAGNOSIS — Z3480 Encounter for supervision of other normal pregnancy, unspecified trimester: Secondary | ICD-10-CM

## 2021-06-30 DIAGNOSIS — E559 Vitamin D deficiency, unspecified: Secondary | ICD-10-CM

## 2021-06-30 NOTE — Progress Notes (Signed)
   PRENATAL VISIT NOTE  Subjective:  Olivia Gardner is a 30 y.o. G2P1001 at [redacted]w[redacted]d being seen today for ongoing prenatal care.  She is currently monitored for the following issues for this high-risk pregnancy and has Tendonitis; Elevated LDL cholesterol level; Tinnitus; Epicondylitis elbow, medial; Hyperlipidemia; Ganglion of right wrist; Vitamin D deficiency; Low iron stores; Post partum thyroiditis; Other dietary vitamin B12 deficiency anemia; Pica in adults; Elevated TSH; Supervision of other normal pregnancy, antepartum; and Pelvic pain affecting pregnancy on their problem list.  Patient reports  constipation .  Contractions: Not present. Vag. Bleeding: None.  Movement: Present. Denies leaking of fluid.   The following portions of the patient's history were reviewed and updated as appropriate: allergies, current medications, past family history, past medical history, past social history, past surgical history and problem list.   Objective:   Vitals:   06/30/21 0948  BP: 109/71  Pulse: 93  Weight: 145 lb (65.8 kg)    Fetal Status: Fetal Heart Rate (bpm): 145   Movement: Present     General:  Alert, oriented and cooperative. Patient is in no acute distress.  Skin: Skin is warm and dry. No rash noted.   Cardiovascular: Normal heart rate noted  Respiratory: Normal respiratory effort, no problems with respiration noted  Abdomen: Soft, gravid, appropriate for gestational age.  Pain/Pressure: Absent     Pelvic: Cervical exam deferred        Extremities: Normal range of motion.  Edema: Trace  Mental Status: Normal mood and affect. Normal behavior. Normal judgment and thought content.   Assessment and Plan:  Pregnancy: G2P1001 at [redacted]w[redacted]d 1. Vitamin D deficiency - VITAMIN D 25 Hydroxy (Vit-D Deficiency, Fractures) Last months lab was not processed 2.  Anemia iron q.o.d.; check B12 and folate 3.  FGR Continue weekly testing, dopplers and serial growth [place] with MFM Kick counts 4.  Hx  low TSH Normal in June 2022 5.  Miralax for constipation  Preterm labor symptoms and general obstetric precautions including but not limited to vaginal bleeding, contractions, leaking of fluid and fetal movement were reviewed in detail with the patient. Please refer to After Visit Summary for other counseling recommendations.   No follow-ups on file.  Future Appointments  Date Time Provider Department Center  07/06/2021  9:15 AM [facility] NURSE [facility]  07/06/2021  9:30 AM [facility] US3 [facility]  07/14/2021  8:45 AM [facility] NURSE [facility]  07/14/2021  9:00 AM [facility] US1 [facility]    [name], MD

## 2021-07-01 LAB — B12 AND FOLATE PANEL
Folate: >24 ng/mL
Vitamin B-12: 261 pg/mL (ref 200–1100)

## 2021-07-01 LAB — VITAMIN D 25 HYDROXY (VIT D DEFICIENCY, FRACTURES): Vit D, 25-Hydroxy: 21 ng/mL — ABNORMAL LOW (ref 30–100)

## 2021-07-06 ENCOUNTER — Other Ambulatory Visit: Payer: Self-pay

## 2021-07-06 ENCOUNTER — Encounter: Payer: Self-pay | Admitting: *Deleted

## 2021-07-06 ENCOUNTER — Ambulatory Visit: Payer: 59 | Attending: Obstetrics

## 2021-07-06 ENCOUNTER — Other Ambulatory Visit: Payer: Self-pay | Admitting: *Deleted

## 2021-07-06 ENCOUNTER — Ambulatory Visit: Payer: 59 | Admitting: *Deleted

## 2021-07-06 VITALS — BP 119/81 | HR 84

## 2021-07-06 DIAGNOSIS — O365930 Maternal care for other known or suspected poor fetal growth, third trimester, not applicable or unspecified: Secondary | ICD-10-CM

## 2021-07-06 DIAGNOSIS — Z3A32 32 weeks gestation of pregnancy: Secondary | ICD-10-CM | POA: Diagnosis not present

## 2021-07-06 DIAGNOSIS — Z362 Encounter for other antenatal screening follow-up: Secondary | ICD-10-CM

## 2021-07-06 DIAGNOSIS — Z3480 Encounter for supervision of other normal pregnancy, unspecified trimester: Secondary | ICD-10-CM | POA: Insufficient documentation

## 2021-07-14 ENCOUNTER — Encounter: Payer: Self-pay | Admitting: *Deleted

## 2021-07-14 ENCOUNTER — Other Ambulatory Visit: Payer: Self-pay

## 2021-07-14 ENCOUNTER — Ambulatory Visit: Payer: 59 | Admitting: *Deleted

## 2021-07-14 ENCOUNTER — Other Ambulatory Visit: Payer: Self-pay | Admitting: Maternal & Fetal Medicine

## 2021-07-14 ENCOUNTER — Ambulatory Visit: Payer: 59 | Attending: Obstetrics

## 2021-07-14 VITALS — BP 123/70 | HR 82

## 2021-07-14 DIAGNOSIS — Z362 Encounter for other antenatal screening follow-up: Secondary | ICD-10-CM | POA: Diagnosis not present

## 2021-07-14 DIAGNOSIS — O368930 Maternal care for other specified fetal problems, third trimester, not applicable or unspecified: Secondary | ICD-10-CM | POA: Diagnosis not present

## 2021-07-14 DIAGNOSIS — O43193 Other malformation of placenta, third trimester: Secondary | ICD-10-CM

## 2021-07-14 DIAGNOSIS — O365930 Maternal care for other known or suspected poor fetal growth, third trimester, not applicable or unspecified: Secondary | ICD-10-CM

## 2021-07-14 DIAGNOSIS — O365990 Maternal care for other known or suspected poor fetal growth, unspecified trimester, not applicable or unspecified: Secondary | ICD-10-CM | POA: Diagnosis present

## 2021-07-14 DIAGNOSIS — Z3480 Encounter for supervision of other normal pregnancy, unspecified trimester: Secondary | ICD-10-CM | POA: Insufficient documentation

## 2021-07-14 DIAGNOSIS — Z3A33 33 weeks gestation of pregnancy: Secondary | ICD-10-CM

## 2021-07-14 DIAGNOSIS — Q270 Congenital absence and hypoplasia of umbilical artery: Secondary | ICD-10-CM

## 2021-07-14 NOTE — Procedures (Signed)
Olivia Gardner 1991-04-19 [redacted]w[redacted]d  Fetus A Non-Stress Test Interpretation for 07/14/21  Indication:  FGR  Fetal Heart Rate A Mode: External Baseline Rate (A): 130 bpm Variability: Moderate Accelerations: 15 x 15 Decelerations: None Multiple birth?: No  Uterine Activity Mode: Palpation, Toco Contraction Frequency (min): 1 uc Contraction Duration (sec): 60 Contraction Quality: Mild Resting Tone Palpated: Relaxed Resting Time: Adequate  Interpretation (Fetal Testing) Nonstress Test Interpretation: Reactive Overall Impression: Reassuring for gestational age Comments: Dr. Grace Bushy reviewed tracing.

## 2021-07-15 ENCOUNTER — Ambulatory Visit (INDEPENDENT_AMBULATORY_CARE_PROVIDER_SITE_OTHER): Payer: 59 | Admitting: Obstetrics & Gynecology

## 2021-07-15 VITALS — BP 106/67 | HR 94 | Wt 147.0 lb

## 2021-07-15 DIAGNOSIS — E559 Vitamin D deficiency, unspecified: Secondary | ICD-10-CM

## 2021-07-15 DIAGNOSIS — Z3480 Encounter for supervision of other normal pregnancy, unspecified trimester: Secondary | ICD-10-CM

## 2021-07-15 DIAGNOSIS — O99013 Anemia complicating pregnancy, third trimester: Secondary | ICD-10-CM

## 2021-07-15 LAB — CBC
HCT: 30.9 % — ABNORMAL LOW (ref 35.0–45.0)
Hemoglobin: 9.6 g/dL — ABNORMAL LOW (ref 11.7–15.5)
MCH: 23.5 pg — ABNORMAL LOW (ref 27.0–33.0)
MCHC: 31.1 g/dL — ABNORMAL LOW (ref 32.0–36.0)
MCV: 75.6 fL — ABNORMAL LOW (ref 80.0–100.0)
MPV: 11.1 fL (ref 7.5–12.5)
Platelets: 209 10*3/uL (ref 140–400)
RBC: 4.09 10*6/uL (ref 3.80–5.10)
RDW: 16.6 % — ABNORMAL HIGH (ref 11.0–15.0)
WBC: 10.6 10*3/uL (ref 3.8–10.8)

## 2021-07-15 NOTE — Progress Notes (Signed)
   PRENATAL VISIT NOTE  Subjective:  Olivia Gardner is a 30 y.o. G2P1001 at [redacted]w[redacted]d being seen today for ongoing prenatal care.  She is currently monitored for the following issues for this high-risk pregnancy and has Tendonitis; Elevated LDL cholesterol level; Tinnitus; Epicondylitis elbow, medial; Hyperlipidemia; Ganglion of right wrist; Vitamin D deficiency; Low iron stores; Post partum thyroiditis; Other dietary vitamin B12 deficiency anemia; Pica in adults; Elevated TSH; Supervision of other normal pregnancy, antepartum; and Pelvic pain affecting pregnancy on their problem list.  Patient reports  URI (covid neg) .  Contractions: Not present. Vag. Bleeding: None.  Movement: Present. Denies leaking of fluid.   The following portions of the patient's history were reviewed and updated as appropriate: allergies, current medications, past family history, past medical history, past social history, past surgical history and problem list.   Objective:   Vitals:   07/15/21 1001  BP: 106/67  Pulse: 94  Weight: 147 lb (66.7 kg)    Fetal Status: Fetal Heart Rate (bpm): 141   Movement: Present     General:  Alert, oriented and cooperative. Patient is in no acute distress.  Skin: Skin is warm and dry. No rash noted.   Cardiovascular: Normal heart rate noted  Respiratory: Normal respiratory effort, no problems with respiration noted  Abdomen: Soft, gravid, appropriate for gestational age.  Pain/Pressure: Present     Pelvic: Cervical exam deferred        Extremities: Normal range of motion.  Edema: Trace  Mental Status: Normal mood and affect. Normal behavior. Normal judgment and thought content.   Assessment and Plan:  Pregnancy: G2P1001 at [redacted]w[redacted]d 1. Vitamin D deficiency Increasd to 4000 units a day Continue iron; recheck cbc  2. Fetal Growth Restriction, antepartum Impression  Follow up growth due to prior FGR  Positive interval growth with measurements consistent with  fetal growth  restriction  Good fetal movement and amniotic fluid volume  UA Dopplers are normal without evidence of AEDF or REDF.  Biophysical profile 8/10  Umbilical vein varix seen previously measured 73mm today  which is within normal limits. ---------------------------------------------------------------------- Recommendations  Continue weekly testing with UA Dopplers  Repeat growth in 3-4 weeks.  Preterm labor symptoms and general obstetric precautions including but not limited to vaginal bleeding, contractions, leaking of fluid and fetal movement were reviewed in detail with the patient. Please refer to After Visit Summary for other counseling recommendations.   No follow-ups on file.  Future Appointments  Date Time Provider Department Center  07/21/2021  1:45 PM [facility] NURSE [facility]  07/21/2021  2:00 PM [facility] US1 [facility]  07/27/2021 11:00 AM [facility] NURSE [facility]  07/27/2021 11:15 AM [facility] US2 [facility]  08/03/2021  7:30 AM [facility]URSE [facility]  08/03/2021  7:45 AM [facility] US5 [facility]  08/09/2021  2:30 PM [facility] NURSE [facility]  08/09/2021  2:45 PM [facility] US4 [facility]    [name], MD

## 2021-07-19 ENCOUNTER — Other Ambulatory Visit: Payer: Self-pay | Admitting: Obstetrics

## 2021-07-19 ENCOUNTER — Encounter: Payer: Self-pay | Admitting: Obstetrics & Gynecology

## 2021-07-19 DIAGNOSIS — O365930 Maternal care for other known or suspected poor fetal growth, third trimester, not applicable or unspecified: Secondary | ICD-10-CM

## 2021-07-19 DIAGNOSIS — O99019 Anemia complicating pregnancy, unspecified trimester: Secondary | ICD-10-CM | POA: Insufficient documentation

## 2021-07-21 ENCOUNTER — Encounter: Payer: Self-pay | Admitting: *Deleted

## 2021-07-21 ENCOUNTER — Ambulatory Visit (HOSPITAL_BASED_OUTPATIENT_CLINIC_OR_DEPARTMENT_OTHER): Payer: 59 | Admitting: *Deleted

## 2021-07-21 ENCOUNTER — Other Ambulatory Visit: Payer: Self-pay

## 2021-07-21 ENCOUNTER — Ambulatory Visit: Payer: 59 | Admitting: *Deleted

## 2021-07-21 ENCOUNTER — Ambulatory Visit: Payer: 59 | Attending: Maternal & Fetal Medicine

## 2021-07-21 VITALS — BP 124/69 | HR 87

## 2021-07-21 DIAGNOSIS — Z362 Encounter for other antenatal screening follow-up: Secondary | ICD-10-CM

## 2021-07-21 DIAGNOSIS — O99019 Anemia complicating pregnancy, unspecified trimester: Secondary | ICD-10-CM

## 2021-07-21 DIAGNOSIS — O368930 Maternal care for other specified fetal problems, third trimester, not applicable or unspecified: Secondary | ICD-10-CM | POA: Diagnosis not present

## 2021-07-21 DIAGNOSIS — Z3A34 34 weeks gestation of pregnancy: Secondary | ICD-10-CM

## 2021-07-21 DIAGNOSIS — Z3480 Encounter for supervision of other normal pregnancy, unspecified trimester: Secondary | ICD-10-CM | POA: Diagnosis present

## 2021-07-21 DIAGNOSIS — O365930 Maternal care for other known or suspected poor fetal growth, third trimester, not applicable or unspecified: Secondary | ICD-10-CM | POA: Diagnosis not present

## 2021-07-21 DIAGNOSIS — O283 Abnormal ultrasonic finding on antenatal screening of mother: Secondary | ICD-10-CM | POA: Diagnosis present

## 2021-07-21 DIAGNOSIS — Q270 Congenital absence and hypoplasia of umbilical artery: Secondary | ICD-10-CM

## 2021-07-21 NOTE — Procedures (Signed)
Olivia Gardner [birth year] [redacted]w[redacted]d  Fetus A Non-Stress Test Interpretation for 07/21/21  Indication: Unsatisfactory BPP  Fetal Heart Rate A Mode: External Baseline Rate (A): 135 bpm Variability: Moderate Accelerations: 15 x 15 Decelerations: None Multiple birth?: No  Uterine Activity Mode: Palpation, Toco Contraction Frequency (min): ui Contraction Duration (sec): 30-40 Contraction Quality: Mild Resting Tone Palpated: Relaxed Resting Time: Adequate  Interpretation (Fetal Testing) Nonstress Test Interpretation: Reactive Overall Impression: Reassuring for gestational age Comments: Dr. Grace Bushy reviewed tracing

## 2021-07-27 ENCOUNTER — Other Ambulatory Visit: Payer: Self-pay | Admitting: Maternal & Fetal Medicine

## 2021-07-27 ENCOUNTER — Other Ambulatory Visit: Payer: Self-pay

## 2021-07-27 ENCOUNTER — Encounter (INDEPENDENT_AMBULATORY_CARE_PROVIDER_SITE_OTHER): Payer: Self-pay

## 2021-07-27 ENCOUNTER — Ambulatory Visit: Payer: 59 | Attending: Maternal & Fetal Medicine

## 2021-07-27 ENCOUNTER — Ambulatory Visit: Payer: 59 | Admitting: *Deleted

## 2021-07-27 VITALS — BP 122/65 | HR 75

## 2021-07-27 DIAGNOSIS — Z3480 Encounter for supervision of other normal pregnancy, unspecified trimester: Secondary | ICD-10-CM

## 2021-07-27 DIAGNOSIS — O365930 Maternal care for other known or suspected poor fetal growth, third trimester, not applicable or unspecified: Secondary | ICD-10-CM | POA: Insufficient documentation

## 2021-07-27 DIAGNOSIS — O99019 Anemia complicating pregnancy, unspecified trimester: Secondary | ICD-10-CM | POA: Insufficient documentation

## 2021-07-27 DIAGNOSIS — Q270 Congenital absence and hypoplasia of umbilical artery: Secondary | ICD-10-CM

## 2021-07-27 DIAGNOSIS — Z3A35 35 weeks gestation of pregnancy: Secondary | ICD-10-CM

## 2021-07-27 DIAGNOSIS — O368930 Maternal care for other specified fetal problems, third trimester, not applicable or unspecified: Secondary | ICD-10-CM | POA: Diagnosis not present

## 2021-07-29 ENCOUNTER — Ambulatory Visit (INDEPENDENT_AMBULATORY_CARE_PROVIDER_SITE_OTHER): Payer: 59 | Admitting: Obstetrics and Gynecology

## 2021-07-29 ENCOUNTER — Other Ambulatory Visit (HOSPITAL_COMMUNITY)
Admission: RE | Admit: 2021-07-29 | Discharge: 2021-07-29 | Disposition: A | Discharge status: Home / Self Care | Payer: 59 | Source: Ambulatory Visit | Attending: Obstetrics and Gynecology | Admitting: Obstetrics and Gynecology

## 2021-07-29 ENCOUNTER — Other Ambulatory Visit: Payer: Self-pay

## 2021-07-29 ENCOUNTER — Encounter: Payer: Self-pay | Admitting: Obstetrics and Gynecology

## 2021-07-29 VITALS — BP 103/70 | HR 84 | Wt 150.0 lb

## 2021-07-29 DIAGNOSIS — N898 Other specified noninflammatory disorders of vagina: Secondary | ICD-10-CM | POA: Insufficient documentation

## 2021-07-29 DIAGNOSIS — O365990 Maternal care for other known or suspected poor fetal growth, unspecified trimester, not applicable or unspecified: Secondary | ICD-10-CM | POA: Insufficient documentation

## 2021-07-29 DIAGNOSIS — Z3480 Encounter for supervision of other normal pregnancy, unspecified trimester: Secondary | ICD-10-CM

## 2021-07-29 NOTE — Progress Notes (Signed)
   PRENATAL VISIT NOTE  Subjective:  Olivia Gardner is a 30 y.o. G2P1001 at [redacted]w[redacted]d being seen today for ongoing prenatal care.  She is currently monitored for the following issues for this high-risk pregnancy and has Tendonitis; Elevated LDL cholesterol level; Tinnitus; Epicondylitis elbow, medial; Hyperlipidemia; Ganglion of right wrist; Vitamin D deficiency; Low iron stores; Post partum thyroiditis; Other dietary vitamin B12 deficiency anemia; Pica in adults; Elevated TSH; Supervision of other normal pregnancy, antepartum; Pelvic pain affecting pregnancy; Anemia, antepartum; and Pregnancy affected by fetal growth restriction on their problem list.  Patient reports vaginal irritation.  Contractions: Irritability. Vag. Bleeding: None.  Movement: Present. Denies leaking of fluid.   The following portions of the patient's history were reviewed and updated as appropriate: allergies, current medications, past family history, past medical history, past social history, past surgical history and problem list.   Objective:   Vitals:   07/29/21 1101  BP: 103/70  Pulse: 84  Weight: 150 lb (68 kg)    Fetal Status:     Movement: Present     General:  Alert, oriented and cooperative. Patient is in no acute distress.  Skin: Skin is warm and dry. No rash noted.   Cardiovascular: Normal heart rate noted  Respiratory: Normal respiratory effort, no problems with respiration noted  Abdomen: Soft, gravid, appropriate for gestational age.  Pain/Pressure: Absent     Pelvic: Cervical exam deferred        Extremities: Normal range of motion.  Edema: Trace  Mental Status: Normal mood and affect. Normal behavior. Normal judgment and thought content.   Assessment and Plan:  Pregnancy: G2P1001 at [redacted]w[redacted]d 1. Supervision of other normal pregnancy, antepartum Patient is doing well reporting some vaginitis Self swab collected    2. Pregnancy affected by fetal growth restriction Follow up ultrasounds as  scheduled by MFM Discussed possibility of 37 week delivery pending ultrasound findings  Preterm labor symptoms and general obstetric precautions including but not limited to vaginal bleeding, contractions, leaking of fluid and fetal movement were reviewed in detail with the patient. Please refer to After Visit Summary for other counseling recommendations.   No follow-ups on file.  Future Appointments  Date Time Provider Department Center  08/02/2021  7:45 AM [facility] NURSE [facility]  08/02/2021  8:00 AM [facility] US1 [facility]  08/09/2021  2:30 PM [facility] NURSE [facility]  08/09/2021  2:45 PM [facility] US4 [facility]    Abdallah Hern, MD

## 2021-08-01 ENCOUNTER — Other Ambulatory Visit: Payer: Self-pay | Admitting: Obstetrics & Gynecology

## 2021-08-01 DIAGNOSIS — B379 Candidiasis, unspecified: Secondary | ICD-10-CM

## 2021-08-01 DIAGNOSIS — O23593 Infection of other part of genital tract in pregnancy, third trimester: Secondary | ICD-10-CM

## 2021-08-01 LAB — CERVICOVAGINAL ANCILLARY ONLY
Bacterial Vaginitis (gardnerella): POSITIVE — AB
Candida Glabrata: POSITIVE — AB
Candida Vaginitis: NEGATIVE
Comment: NEGATIVE
Comment: NEGATIVE
Comment: NEGATIVE

## 2021-08-01 MED ORDER — NYSTATIN 100000 UNIT/GM EX CREA: TOPICAL_CREAM | CUTANEOUS | 0 refills | Status: DC

## 2021-08-01 MED ORDER — TERCONAZOLE 0.4 % VA CREA: 1.0000 | TOPICAL_CREAM | Freq: Every day | VAGINAL | 0 refills | Status: DC

## 2021-08-01 MED ORDER — METRONIDAZOLE 500 MG PO TABS: 500.0000 mg | ORAL_TABLET | Freq: Two times a day (BID) | ORAL | 0 refills | Status: AC

## 2021-08-02 ENCOUNTER — Ambulatory Visit: Payer: 59 | Attending: Maternal & Fetal Medicine

## 2021-08-02 ENCOUNTER — Other Ambulatory Visit: Payer: Self-pay | Admitting: Maternal & Fetal Medicine

## 2021-08-02 ENCOUNTER — Ambulatory Visit: Payer: 59 | Admitting: *Deleted

## 2021-08-02 ENCOUNTER — Other Ambulatory Visit: Payer: Self-pay

## 2021-08-02 VITALS — BP 122/81 | HR 71

## 2021-08-02 DIAGNOSIS — O368930 Maternal care for other specified fetal problems, third trimester, not applicable or unspecified: Secondary | ICD-10-CM

## 2021-08-02 DIAGNOSIS — O365990 Maternal care for other known or suspected poor fetal growth, unspecified trimester, not applicable or unspecified: Secondary | ICD-10-CM | POA: Insufficient documentation

## 2021-08-02 DIAGNOSIS — Q270 Congenital absence and hypoplasia of umbilical artery: Secondary | ICD-10-CM | POA: Diagnosis not present

## 2021-08-02 DIAGNOSIS — Z3480 Encounter for supervision of other normal pregnancy, unspecified trimester: Secondary | ICD-10-CM | POA: Diagnosis present

## 2021-08-02 DIAGNOSIS — O99019 Anemia complicating pregnancy, unspecified trimester: Secondary | ICD-10-CM | POA: Diagnosis present

## 2021-08-02 DIAGNOSIS — O99891 Other specified diseases and conditions complicating pregnancy: Secondary | ICD-10-CM | POA: Diagnosis present

## 2021-08-02 DIAGNOSIS — Z3A36 36 weeks gestation of pregnancy: Secondary | ICD-10-CM | POA: Insufficient documentation

## 2021-08-02 DIAGNOSIS — O365930 Maternal care for other known or suspected poor fetal growth, third trimester, not applicable or unspecified: Secondary | ICD-10-CM | POA: Insufficient documentation

## 2021-08-03 ENCOUNTER — Ambulatory Visit: Payer: 59

## 2021-08-05 ENCOUNTER — Other Ambulatory Visit: Payer: Self-pay

## 2021-08-05 ENCOUNTER — Other Ambulatory Visit (HOSPITAL_COMMUNITY)
Admission: RE | Admit: 2021-08-05 | Discharge: 2021-08-05 | Disposition: A | Discharge status: Home / Self Care | Payer: 59 | Source: Ambulatory Visit | Attending: Obstetrics and Gynecology | Admitting: Obstetrics and Gynecology

## 2021-08-05 ENCOUNTER — Ambulatory Visit (INDEPENDENT_AMBULATORY_CARE_PROVIDER_SITE_OTHER): Payer: 59 | Admitting: Obstetrics and Gynecology

## 2021-08-05 VITALS — BP 125/78 | HR 76 | Wt 155.0 lb

## 2021-08-05 DIAGNOSIS — Z3480 Encounter for supervision of other normal pregnancy, unspecified trimester: Secondary | ICD-10-CM

## 2021-08-05 DIAGNOSIS — O99019 Anemia complicating pregnancy, unspecified trimester: Secondary | ICD-10-CM

## 2021-08-05 DIAGNOSIS — O365990 Maternal care for other known or suspected poor fetal growth, unspecified trimester, not applicable or unspecified: Secondary | ICD-10-CM

## 2021-08-05 DIAGNOSIS — O0990 Supervision of high risk pregnancy, unspecified, unspecified trimester: Secondary | ICD-10-CM | POA: Diagnosis not present

## 2021-08-05 LAB — OB RESULTS CONSOLE GBS: GBS: NEGATIVE

## 2021-08-05 NOTE — Progress Notes (Signed)
   PRENATAL VISIT NOTE  Subjective:  Olivia Gardner is a 30 y.o. G2P1001 at [redacted]w[redacted]d being seen today for ongoing prenatal care.  She is currently monitored for the following issues for this high-risk pregnancy and has Tendonitis; Elevated LDL cholesterol level; Tinnitus; Epicondylitis elbow, medial; Hyperlipidemia; Ganglion of right wrist; Vitamin D deficiency; Low iron stores; Post partum thyroiditis; Other dietary vitamin B12 deficiency anemia; Pica in adults; Elevated TSH; Supervision of other normal pregnancy, antepartum; Pelvic pain affecting pregnancy; Anemia, antepartum; and Pregnancy affected by fetal growth restriction on their problem list.  Patient reports vaginal irritation.  Contractions: Irritability. Vag. Bleeding: None.  Movement: Present. Denies leaking of fluid.   The following portions of the patient's history were reviewed and updated as appropriate: allergies, current medications, past family history, past medical history, past social history, past surgical history and problem list.   Objective:   Vitals:   08/05/21 1601  BP: 125/78  Pulse: 76  Weight: 155 lb (70.3 kg)    Fetal Status: Fetal Heart Rate (bpm): 140   Movement: Present     General:  Alert, oriented and cooperative. Patient is in no acute distress.  Skin: Skin is warm and dry. No rash noted.   Cardiovascular: Normal heart rate noted  Respiratory: Normal respiratory effort, no problems with respiration noted  Abdomen: Soft, gravid, appropriate for gestational age.  Pain/Pressure: Absent     Pelvic: Cervical exam performed in the presence of a chaperone        Extremities: Normal range of motion.  Edema: Trace  Mental Status: Normal mood and affect. Normal behavior. Normal judgment and thought content.   Assessment and Plan:  Pregnancy: G2P1001 at [redacted]w[redacted]d 1. Supervision of high risk pregnancy, antepartum - Culture, beta strep (group b only) - Cervicovaginal ancillary only( [facility])  2. Pregnancy  affected by fetal growth restriction - Growth on 7/20 was 6%ile (HC 11%ile) and continues to measure S<D. Weekly doppler studies are normal but BPP has shown perisistent borderline oligo with AFI around 6. She has next growth on 8/15 at [redacted]w[redacted]d. Unless dramatic improvement in growth, I recommended delivery in 37th week c/w MFM recommendation for combined borderline AFI and FGR. She agrees with plan.  - Paperwork submitted to L&D for next Friday. Patient is aware delivery may be needed sooner if any other change on testing on Monday.   3. Anemia, antepartum - Continue Iron - discussed likely need for IV Iron after delivery if still anemic despite increased Iron regimen  4. Supervision of other normal pregnancy, antepartum - Discussed brands of IUD and based on plans for family, Penni Bombard would be best fit. Plan is for this post-delivery in the hospital  5. Vaginal irritation - Was positive for BV and yeast (glabrata) - she just started therapy about 1 day ago.   Preterm labor symptoms and general obstetric precautions including but not limited to vaginal bleeding, contractions, leaking of fluid and fetal movement were reviewed in detail with the patient. Please refer to After Visit Summary for other counseling recommendations.   Return in about 1 week (around 08/12/2021) for OB VISIT, MD only.  Future Appointments  Date Time Provider Department Center  08/09/2021  2:30 PM [facility] NURSE [facility]  08/09/2021  2:45 PM [facility] US4 [facility]  08/11/2021  3:50 PM Lesly Dukes, MD [facility]  09/07/2021 10:10 AM Leftwich-Kirby, Wilmer Floor, CNM [facility]    [name], MD

## 2021-08-06 LAB — CERVICOVAGINAL ANCILLARY ONLY
Chlamydia: NEGATIVE
Comment: NEGATIVE
Comment: NORMAL
Neisseria Gonorrhea: NEGATIVE

## 2021-08-08 LAB — CULTURE, BETA STREP (GROUP B ONLY)
MICRO NUMBER:: 12233788
SPECIMEN QUALITY:: ADEQUATE

## 2021-08-09 ENCOUNTER — Encounter (HOSPITAL_COMMUNITY): Payer: Self-pay

## 2021-08-09 ENCOUNTER — Other Ambulatory Visit: Payer: Self-pay

## 2021-08-09 ENCOUNTER — Telehealth (HOSPITAL_COMMUNITY): Payer: Self-pay | Admitting: *Deleted

## 2021-08-09 ENCOUNTER — Ambulatory Visit: Payer: 59 | Admitting: *Deleted

## 2021-08-09 ENCOUNTER — Ambulatory Visit: Payer: 59 | Attending: Maternal & Fetal Medicine

## 2021-08-09 ENCOUNTER — Encounter (HOSPITAL_COMMUNITY): Payer: Self-pay | Admitting: *Deleted

## 2021-08-09 VITALS — BP 118/77 | HR 81

## 2021-08-09 DIAGNOSIS — Z3480 Encounter for supervision of other normal pregnancy, unspecified trimester: Secondary | ICD-10-CM | POA: Diagnosis present

## 2021-08-09 DIAGNOSIS — O99019 Anemia complicating pregnancy, unspecified trimester: Secondary | ICD-10-CM

## 2021-08-09 DIAGNOSIS — O365990 Maternal care for other known or suspected poor fetal growth, unspecified trimester, not applicable or unspecified: Secondary | ICD-10-CM | POA: Insufficient documentation

## 2021-08-09 DIAGNOSIS — Z3A37 37 weeks gestation of pregnancy: Secondary | ICD-10-CM | POA: Diagnosis not present

## 2021-08-09 DIAGNOSIS — O365930 Maternal care for other known or suspected poor fetal growth, third trimester, not applicable or unspecified: Secondary | ICD-10-CM | POA: Insufficient documentation

## 2021-08-09 DIAGNOSIS — Q270 Congenital absence and hypoplasia of umbilical artery: Secondary | ICD-10-CM

## 2021-08-09 DIAGNOSIS — O368930 Maternal care for other specified fetal problems, third trimester, not applicable or unspecified: Secondary | ICD-10-CM | POA: Diagnosis not present

## 2021-08-09 NOTE — Telephone Encounter (Signed)
Preadmission screen  

## 2021-08-11 ENCOUNTER — Ambulatory Visit (INDEPENDENT_AMBULATORY_CARE_PROVIDER_SITE_OTHER): Payer: 59 | Admitting: Obstetrics & Gynecology

## 2021-08-11 ENCOUNTER — Other Ambulatory Visit: Payer: Self-pay

## 2021-08-11 ENCOUNTER — Other Ambulatory Visit: Payer: Self-pay | Admitting: Advanced Practice Midwife

## 2021-08-11 VITALS — BP 120/82 | HR 78 | Wt 158.0 lb

## 2021-08-11 DIAGNOSIS — Z3A37 37 weeks gestation of pregnancy: Secondary | ICD-10-CM

## 2021-08-11 DIAGNOSIS — O0990 Supervision of high risk pregnancy, unspecified, unspecified trimester: Secondary | ICD-10-CM

## 2021-08-11 NOTE — Progress Notes (Signed)
   Subjective:  Olivia Gardner is a 30 y.o. G2P1001 at [redacted]w[redacted]d being seen today for ongoing prenatal care.  She is currently monitored for the following issues for this high-risk pregnancy and has Tendonitis; Elevated LDL cholesterol level; Tinnitus; Epicondylitis elbow, medial; Hyperlipidemia; Ganglion of right wrist; Vitamin D deficiency; Low iron stores; Post partum thyroiditis; Other dietary vitamin B12 deficiency anemia; Pica in adults; Elevated TSH; Supervision of other normal pregnancy, antepartum; Pelvic pain affecting pregnancy; Anemia, antepartum; and Pregnancy affected by fetal growth restriction on their problem list.  Patient reports no complaints.  Contractions: Irregular. Vag. Bleeding: None.  Movement: Present. Denies leaking of fluid.  PRENATAL VISIT NOTE   The following portions of the patient's history were reviewed and updated as appropriate: allergies, current medications, past family history, past medical history, past social history, past surgical history and problem list.   Objective:   Vitals:   08/11/21 1555  Weight: 158 lb (71.7 kg)    Fetal Status:     Movement: Present     General:  Alert, oriented and cooperative. Patient is in no acute distress.  Skin: Skin is warm and dry. No rash noted.   Cardiovascular: Normal heart rate noted  Respiratory: Normal respiratory effort, no problems with respiration noted  Abdomen: Soft, gravid, appropriate for gestational age.  Pain/Pressure: Absent     Pelvic: Cervical exam deferred        Extremities: Normal range of motion.  Edema: Trace  Mental Status: Normal mood and affect. Normal behavior. Normal judgment and thought content.   Assessment and Plan:  Pregnancy: G2P1001 at [redacted]w[redacted]d   IUGR--11%.  Induction this week scheduled.  Liletta post placental RTC 5 weeks after delivery.   Term labor symptoms and general obstetric precautions including but not limited to vaginal bleeding, contractions, leaking of fluid and  fetal movement were reviewed in detail with the patient. Please refer to After Visit Summary for other counseling recommendations.   No follow-ups on file.  Future Appointments  Date Time Provider Department Center  08/14/2021 12:00 AM [facility] SCHED ROOM [facility] None  09/07/2021 10:10 AM Leftwich-Kirby, Wilmer Floor, CNM [facility]    [name], MD

## 2021-08-12 ENCOUNTER — Other Ambulatory Visit: Payer: Self-pay | Admitting: Obstetrics & Gynecology

## 2021-08-12 LAB — SARS CORONAVIRUS 2 (TAT 6-24 HRS): SARS Coronavirus 2: NEGATIVE

## 2021-08-13 ENCOUNTER — Inpatient Hospital Stay (HOSPITAL_COMMUNITY): Payer: 59

## 2021-08-14 ENCOUNTER — Inpatient Hospital Stay (HOSPITAL_COMMUNITY): Payer: 59

## 2021-08-14 ENCOUNTER — Inpatient Hospital Stay (HOSPITAL_COMMUNITY)
Admission: AD | Admit: 2021-08-14 | Discharge: 2021-08-16 | DRG: 798 | Disposition: A | Discharge status: Home / Self Care | Payer: 59 | Attending: Obstetrics and Gynecology | Admitting: Obstetrics and Gynecology

## 2021-08-14 ENCOUNTER — Inpatient Hospital Stay (HOSPITAL_COMMUNITY): Payer: 59 | Admitting: Anesthesiology

## 2021-08-14 ENCOUNTER — Encounter (HOSPITAL_COMMUNITY): Payer: Self-pay | Admitting: Obstetrics & Gynecology

## 2021-08-14 ENCOUNTER — Other Ambulatory Visit: Payer: Self-pay

## 2021-08-14 DIAGNOSIS — O365930 Maternal care for other known or suspected poor fetal growth, third trimester, not applicable or unspecified: Secondary | ICD-10-CM | POA: Diagnosis present

## 2021-08-14 DIAGNOSIS — Z30430 Encounter for insertion of intrauterine contraceptive device: Secondary | ICD-10-CM | POA: Diagnosis not present

## 2021-08-14 DIAGNOSIS — Z3A37 37 weeks gestation of pregnancy: Secondary | ICD-10-CM | POA: Diagnosis not present

## 2021-08-14 DIAGNOSIS — IMO0002 Reserved for concepts with insufficient information to code with codable children: Secondary | ICD-10-CM | POA: Diagnosis present

## 2021-08-14 LAB — CBC
HCT: 34.4 % — ABNORMAL LOW (ref 36.0–46.0)
Hemoglobin: 10.3 g/dL — ABNORMAL LOW (ref 12.0–15.0)
MCH: 24.2 pg — ABNORMAL LOW (ref 26.0–34.0)
MCHC: 29.9 g/dL — ABNORMAL LOW (ref 30.0–36.0)
MCV: 80.8 fL (ref 80.0–100.0)
Platelets: 240 10*3/uL (ref 150–400)
RBC: 4.26 MIL/uL (ref 3.87–5.11)
RDW: 20.8 % — ABNORMAL HIGH (ref 11.5–15.5)
WBC: 11.8 10*3/uL — ABNORMAL HIGH (ref 4.0–10.5)
nRBC: 0 % (ref 0.0–0.2)

## 2021-08-14 LAB — TYPE AND SCREEN
ABO/RH(D): O POS
Antibody Screen: NEGATIVE

## 2021-08-14 LAB — RPR: RPR Ser Ql: NONREACTIVE

## 2021-08-14 MED ORDER — ONDANSETRON HCL 4 MG/2ML IJ SOLN: 4.0000 mg | Freq: Four times a day (QID) | INTRAMUSCULAR | Status: DC | PRN

## 2021-08-14 MED ORDER — OXYTOCIN-SODIUM CHLORIDE 30-0.9 UT/500ML-% IV SOLN
2.5000 [IU]/h | INTRAVENOUS | Status: DC
  Filled 2021-08-14: qty 500

## 2021-08-14 MED ORDER — OXYTOCIN BOLUS FROM INFUSION
333.0000 mL | Freq: Once | INTRAVENOUS | Status: AC
  Administered 2021-08-15: 333 mL via INTRAVENOUS

## 2021-08-14 MED ORDER — TERBUTALINE SULFATE 1 MG/ML IJ SOLN: 0.2500 mg | Freq: Once | INTRAMUSCULAR | Status: DC | PRN

## 2021-08-14 MED ORDER — DIPHENHYDRAMINE HCL 50 MG/ML IJ SOLN
12.5000 mg | INTRAMUSCULAR | Status: DC | PRN
Start: 2021-08-14 — End: 2021-08-15

## 2021-08-14 MED ORDER — EPHEDRINE 5 MG/ML INJ
10.0000 mg | INTRAVENOUS | Status: DC | PRN
Start: 2021-08-14 — End: 2021-08-15

## 2021-08-14 MED ORDER — OXYTOCIN-SODIUM CHLORIDE 30-0.9 UT/500ML-% IV SOLN
1.0000 m[IU]/min | INTRAVENOUS | Status: DC
  Administered 2021-08-14: 2 m[IU]/min via INTRAVENOUS

## 2021-08-14 MED ORDER — LACTATED RINGERS IV SOLN
500.0000 mL | Freq: Once | INTRAVENOUS | Status: AC
  Administered 2021-08-14: 500 mL via INTRAVENOUS

## 2021-08-14 MED ORDER — LIDOCAINE HCL (PF) 1 % IJ SOLN: 30.0000 mL | INTRAMUSCULAR | Status: DC | PRN

## 2021-08-14 MED ORDER — LIDOCAINE HCL (PF) 1 % IJ SOLN
INTRAMUSCULAR | Status: DC | PRN
  Administered 2021-08-14: 8 mL via EPIDURAL

## 2021-08-14 MED ORDER — FENTANYL-BUPIVACAINE-NACL 0.5-0.125-0.9 MG/250ML-% EP SOLN: 12.0000 mL/h | EPIDURAL | Status: DC | PRN

## 2021-08-14 MED ORDER — SOD CITRATE-CITRIC ACID 500-334 MG/5ML PO SOLN: 30.0000 mL | ORAL | Status: DC | PRN

## 2021-08-14 MED ORDER — OXYCODONE-ACETAMINOPHEN 5-325 MG PO TABS: 1.0000 | ORAL_TABLET | ORAL | Status: DC | PRN

## 2021-08-14 MED ORDER — LEVONORGESTREL 20.1 MCG/DAY IU IUD
1.0000 | INTRAUTERINE_SYSTEM | Freq: Once | INTRAUTERINE | Status: AC
  Administered 2021-08-15: 1 via INTRAUTERINE
  Filled 2021-08-14: qty 1

## 2021-08-14 MED ORDER — FENTANYL-BUPIVACAINE-NACL 0.5-0.125-0.9 MG/250ML-% EP SOLN
12.0000 mL/h | EPIDURAL | Status: DC | PRN
  Administered 2021-08-14: 12 mL/h via EPIDURAL

## 2021-08-14 MED ORDER — LACTATED RINGERS IV SOLN: 500.0000 mL | INTRAVENOUS | Status: DC | PRN

## 2021-08-14 MED ORDER — FENTANYL-BUPIVACAINE-NACL 0.5-0.125-0.9 MG/250ML-% EP SOLN
EPIDURAL | Status: AC
  Filled 2021-08-14: qty 250

## 2021-08-14 MED ORDER — LACTATED RINGERS IV SOLN: INTRAVENOUS | Status: DC

## 2021-08-14 MED ORDER — ACETAMINOPHEN 325 MG PO TABS
650.0000 mg | ORAL_TABLET | ORAL | Status: DC | PRN
Start: 2021-08-14 — End: 2021-08-15
  Administered 2021-08-14: 650 mg via ORAL
  Filled 2021-08-14: qty 2

## 2021-08-14 MED ORDER — PHENYLEPHRINE 40 MCG/ML (10ML) SYRINGE FOR IV PUSH (FOR BLOOD PRESSURE SUPPORT): 80.0000 ug | PREFILLED_SYRINGE | INTRAVENOUS | Status: DC | PRN

## 2021-08-14 MED ORDER — OXYCODONE-ACETAMINOPHEN 5-325 MG PO TABS
2.0000 | ORAL_TABLET | ORAL | Status: DC | PRN
Start: 2021-08-14 — End: 2021-08-15

## 2021-08-14 MED ORDER — DIPHENHYDRAMINE HCL 50 MG/ML IJ SOLN: 12.5000 mg | INTRAMUSCULAR | Status: DC | PRN

## 2021-08-14 MED ORDER — EPHEDRINE 5 MG/ML INJ: 10.0000 mg | INTRAVENOUS | Status: DC | PRN

## 2021-08-14 MED ORDER — FENTANYL CITRATE (PF) 100 MCG/2ML IJ SOLN: 100.0000 ug | INTRAMUSCULAR | Status: DC | PRN

## 2021-08-14 MED ORDER — MISOPROSTOL 25 MCG QUARTER TABLET
25.0000 ug | ORAL_TABLET | ORAL | Status: DC | PRN
  Administered 2021-08-14 (×2): 25 ug via VAGINAL
  Filled 2021-08-14 (×2): qty 1

## 2021-08-14 NOTE — Anesthesia Preprocedure Evaluation (Signed)
Anesthesia Evaluation  Patient identified by MRN, date of birth, ID band Patient awake    Reviewed: Allergy & Precautions, H&P , NPO status , Patient's Chart, lab work & pertinent test results, reviewed documented beta blocker date and time   Airway Mallampati: II  TM Distance: >3 FB Neck ROM: full    Dental no notable dental hx.    Pulmonary neg pulmonary ROS,    Pulmonary exam normal breath sounds clear to auscultation       Cardiovascular negative cardio ROS Normal cardiovascular exam Rhythm:regular Rate:Normal     Neuro/Psych negative neurological ROS  negative psych ROS   GI/Hepatic negative GI ROS, Neg liver ROS,   Endo/Other  negative endocrine ROS  Renal/GU negative Renal ROS  negative genitourinary   Musculoskeletal   Abdominal   Peds  Hematology negative hematology ROS (+)   Anesthesia Other Findings   Reproductive/Obstetrics (+) Pregnancy                             Anesthesia Physical Anesthesia Plan  ASA: 2  Anesthesia Plan: Epidural   Post-op Pain Management:    Induction:   PONV Risk Score and Plan:   Airway Management Planned: Natural Airway  Additional Equipment: None  Intra-op Plan:   Post-operative Plan:   Informed Consent: I have reviewed the patients History and Physical, chart, labs and discussed the procedure including the risks, benefits and alternatives for the proposed anesthesia with the patient or authorized representative who has indicated his/her understanding and acceptance.     Dental Advisory Given  Plan Discussed with: Anesthesiologist  Anesthesia Plan Comments: (Labs checked- platelets confirmed with RN in room. Fetal heart tracing, per RN, reported to be stable enough for sitting procedure. Discussed epidural, and patient consents to the procedure:  included risk of possible headache,backache, failed block, allergic reaction, and  nerve injury. This patient was asked if she had any questions or concerns before the procedure started.)        Anesthesia Quick Evaluation  

## 2021-08-14 NOTE — H&P (Signed)
OBSTETRIC ADMISSION HISTORY AND PHYSICAL  Olivia Gardner is a 30 y.o. female G2P1001 with IUP at [redacted]w[redacted]d by LMP presenting for IOL for IUGR. She reports +FMs, No LOF, no VB, no blurry vision, headaches or peripheral edema, and RUQ pain.  She plans on breast feeding. She request post placental IUD for birth control. She received her prenatal care at  [place]    Dating: By LMP --->  Estimated Date of Delivery: 08/30/21  Sono:    @[redacted]w[redacted]d, CWD, normal anatomy, Cephalic presentation, 2546 g, 11% EFW   Prenatal History/Complications: IUGR, umbilical vein abnormality (resolved on scan on 8/2)  Past Medical History: Past Medical History:  Diagnosis Date   Abnormal weight gain 06/25/2019   Hyperlipidemia    Vitamin D deficiency     Past Surgical History: Past Surgical History:  Procedure Laterality Date   WISDOM TOOTH EXTRACTION      Obstetrical History: OB History     Gravida  2   Para  1   Term  1   Preterm      AB      Living  1      SAB      IAB      Ectopic      Multiple  0   Live Births  1           Social History Social History   Socioeconomic History   Marital status: Married    Spouse name: Not on file   Number of children: Not on file   Years of education: Not on file   Highest education level: Not on file  Occupational History   Not on file  Tobacco Use   Smoking status: Never   Smokeless tobacco: Never  Vaping Use   Vaping Use: Never used  Substance and Sexual Activity   Alcohol use: Not Currently    Alcohol/week: 0.0 standard drinks   Drug use: No   Sexual activity: Yes    Birth control/protection: Inserts  Other Topics Concern   Not on file  Social History Narrative   Not on file   Social Determinants of Health   Financial Resource Strain: Not on file  Food Insecurity: Not on file  Transportation Needs: Not on file  Physical Activity: Not on file  Stress: Not on file  Social Connections: Not on file    Family  History: Family History  Problem Relation Age of Onset   Hyperlipidemia Mother    Hypertension Father    Diabetes Maternal Grandmother    Diabetes Paternal Grandmother    Asthma Neg Hx    Stroke Neg Hx     Allergies: Allergies  Allergen Reactions   Sulfacetamide Sodium Other (See Comments)    Per Patient caused damage to her eye to the point she has to get new prescription lenses.  Her eye doctor wanted it noted she can not take these any more.    Medications Prior to Admission  Medication Sig Dispense Refill Last Dose   acetaminophen (TYLENOL) 500 MG tablet Take 500 mg by mouth every 6 (six) hours as needed.      cetirizine (ZYRTEC) 10 MG tablet Take 10 mg by mouth daily.      Cholecalciferol (VITAMIN D) 50 MCG (2000 UT) tablet       ferrous sulfate (FERROUSUL) 325 (65 FE) MG tablet Take 1 tablet (325 mg total) by mouth every other day. (Patient taking differently: Take 325 mg by mouth daily.) 30 tablet 2      nystatin cream (MYCOSTATIN) One full applicator (one gram) inserted vaginally for fourteen nights 15 g 0    polyethylene glycol (MIRALAX / GLYCOLAX) 17 g packet Take 17 g by mouth daily.      Prenat-Fe Carbonyl-FA-Omega 3 (ONE-A-DAY WOMENS PRENATAL 1) 28-0.8-235 MG CAPS         Review of Systems   All systems reviewed and negative except as stated in HPI  Blood pressure 128/86, pulse 68, temperature 98.5 F (36.9 C), temperature source Oral, resp. rate 18, height 5' 0.5" (1.537 m), weight 71.7 kg, last menstrual period 11/23/2020. General appearance: alert, cooperative, appears stated age, and no distress Lungs: normal work of breathing on room air  Heart: regular rate and rhythm Abdomen: soft, non-tender; bowel sounds normal Pelvic: Normal external female genitalia Extremities: Homans sign is negative, no sign of DVT Presentation: cephalic Fetal monitoringBaseline: 140 bpm, Variability: Good {> 6 bpm), and Accelerations: Reactive Uterine activity:  now having  spontaneous contractions on monitor, spaced every 3-4 minutes, but not feeling them strongly Dilation: 2 Effacement (%): Thick Station: -3 Exam by:: Delrae Sawyers RN   Prenatal labs: ABO, Rh: --/--/O POS (08/20 1035) Antibody: NEG (08/20 1035) Rubella: 4.93 (02/08 1559) RPR: NON REACTIVE (08/20 1030)  HBsAg: NON-REACTIVE (02/08 1559)  HIV: NON-REACTIVE (06/14 0900)  GBS:   Neg 1 hr Glucola WNL Genetic screening  Low Risk NIPS Anatomy US Normal   Prenatal Transfer Tool  Maternal Diabetes: No Genetic Screening: Normal Maternal Ultrasounds/Referrals: IUGR Fetal Ultrasounds or other Referrals:  None Maternal Substance Abuse:  No Significant Maternal Medications:  None Significant Maternal Lab Results: Group B Strep negative  Results for orders placed or performed during the hospital encounter of 08/14/21 (from the past 24 hour(s))  CBC   Collection Time: 08/14/21 10:30 AM  Result Value Ref Range   WBC 11.8 (H) 4.0 - 10.5 K/uL   RBC 4.26 3.87 - 5.11 MIL/uL   Hemoglobin 10.3 (L) 12.0 - 15.0 g/dL   HCT 17.5 (L) 10.2 - 58.5 %   MCV 80.8 80.0 - 100.0 fL   MCH 24.2 (L) 26.0 - 34.0 pg   MCHC 29.9 (L) 30.0 - 36.0 g/dL   RDW 27.7 (H) 82.4 - 23.5 %   Platelets 240 150 - 400 K/uL   nRBC 0.0 0.0 - 0.2 %  RPR   Collection Time: 08/14/21 10:30 AM  Result Value Ref Range   RPR Ser Ql NON REACTIVE NON REACTIVE  Type and screen   Collection Time: 08/14/21 10:35 AM  Result Value Ref Range   ABO/RH(D) O POS    Antibody Screen NEG    Sample Expiration      08/17/2021,2359 Performed at [facility], [address], [place], [place] [address]     Patient Active Problem List   Diagnosis Date Noted   Poor fetal growth 08/14/2021   Pregnancy affected by fetal growth restriction 07/29/2021   Anemia, antepartum 07/19/2021   Pelvic pain affecting pregnancy 05/17/2021   Supervision of other normal pregnancy, antepartum 02/03/2021   Pica in adults 07/31/2020   Elevated TSH  07/31/2020   Other dietary vitamin B12 deficiency anemia 09/24/2019   Post partum thyroiditis 06/26/2019   Vitamin D deficiency 03/28/2016   Low iron stores 03/28/2016   Hyperlipidemia 03/25/2016   Ganglion of right wrist 03/25/2016   Epicondylitis elbow, medial 02/02/2015   Tendonitis 01/30/2015   Elevated LDL cholesterol level 01/30/2015   Tinnitus 01/30/2015    Assessment/Plan:  Larena Glassman  is a 30 y.o. G2P1001 at [redacted]w[redacted]d here for IOL for IUGR  #Labor: Not in labor on presentation, will start induction with foley and cytotec #Pain: PRN, planning for epidural [name]: Cat I #ID:  GBS neg #MOF: Breast #MOC:PP IUD, consented #Circ:  NA, girl  Dorothyann Gibbs, MD  08/14/2021, 1:05 PM

## 2021-08-14 NOTE — Progress Notes (Signed)
Olivia Gardner is a 30 y.o. G2P1001 at [redacted]w[redacted]d admitted for induction of labor due to IUGR.  Subjective: Patient doing well, husband supportive at bedside.  Objective: BP 127/78   Pulse 70   Temp 98.5 F (36.9 C) (Oral)   Resp 20   Ht 5' 0.5" (1.537 m)   Wt 71.7 kg   LMP 11/23/2020   BMI 30.35 kg/m  No intake/output data recorded. No intake/output data recorded.  FHT:  FHR: 130 bpm, variability: moderate,  accelerations:  Present,  decelerations:  Absent UC:   irregular, every 5-6 minutes SVE:   Dilation: 5 Effacement (%): 50 Station: -2 Exam by:: Elwyn Reach, RN  Labs: Lab Results  Component Value Date   WBC 11.8 (H) 08/14/2021   HGB 10.3 (L) 08/14/2021   HCT 34.4 (L) 08/14/2021   MCV 80.8 08/14/2021   PLT 240 08/14/2021    Assessment / Plan: Induction of labor due to IUGR,  progressing well.  Labor:  FB out, Plan to start pitocin.  Preeclampsia:   n/a Fetal Wellbeing:  Category I Pain Control:  IV pain meds I/D:   GBS neg Anticipated MOD:  NSVD  Shirlean Mylar 08/14/2021, 9:39 PM

## 2021-08-14 NOTE — Anesthesia Procedure Notes (Signed)
Epidural Patient location during procedure: OB Start time: 08/14/2021 11:01 PM End time: 08/14/2021 11:07 PM  Staffing Anesthesiologist: Bethena Midget, MD  Preanesthetic Checklist Completed: patient identified, IV checked, site marked, risks and benefits discussed, surgical consent, monitors and equipment checked, pre-op evaluation and timeout performed  Epidural Patient position: sitting Prep: DuraPrep and site prepped and draped Patient monitoring: continuous pulse ox and blood pressure Approach: midline Location: L3-L4 Injection technique: LOR air  Needle:  Needle type: Tuohy  Needle gauge: 17 G Needle length: 9 cm and 9 Needle insertion depth: 6 cm Catheter type: closed end flexible Catheter size: 19 Gauge Catheter at skin depth: 12 cm Test dose: negative  Assessment Events: blood not aspirated, injection not painful, no injection resistance, no paresthesia and negative IV test

## 2021-08-14 NOTE — Progress Notes (Signed)
Labor Progress Note Olivia Gardner is a 30 y.o. G2P1001 at [redacted]w[redacted]d presented for  IOL for IUGR.  S: Paged by RN due to patient with new onset headache and floaters in visual field. Patient reports that these resolved once she ate. She denies any RUQ pain, nausea, or blurring of vision.  Patient reports feeling well after food. Feeling contractions more strongly than before.   O:  BP 125/86   Pulse 77   Temp 98.5 F (36.9 C) (Oral)   Resp 20   Ht 5' 0.5" (1.537 m)   Wt 71.7 kg   LMP 11/23/2020   BMI 30.35 kg/m  EFM: 135/moderate/accelerations present Neuro: A&Ox4, speech fluent, CN II-VII intact, strength and sensation intact throughout  CVE: Dilation: 2 Effacement (%): Thick Cervical Position: Posterior Station: -3 Presentation: Vertex Exam by:: S Aurélie.Currier RN   A&P: 30 y.o. G2P1001 [redacted]w[redacted]d  #Headache/Visual Disturbances: Suspect transient hypoglycemia, thought was not able to obtain CBG before patient ate. No concern for pre E given normal blood pressures and resolution of symptoms with food intake. Encouraged continued intake of light labor diet.  #Labor: Progressing well. Feeling CTX more strongly. Foley bulb still in place.  #Pain: Well-controlled at this time [name]: Cat I [name]   Dorothyann Gibbs, MD 5:54 PM

## 2021-08-14 NOTE — Progress Notes (Signed)
Olivia Gardner is a 30 y.o. G2P1001 at [redacted]w[redacted]d.  Subjective: Mild cramping.   Objective: BP 125/86   Pulse 77   Temp 98.5 F (36.9 C) (Oral)   Resp 20   Ht 5' 0.5" (1.537 m)   Wt 71.7 kg   LMP 11/23/2020   BMI 30.35 kg/m    FHT:  FHR: 135 bpm, variability: mod,  accelerations:  15x15,  decelerations:  none UC:   Q 2-5 minutes, mild Dilation: 2 Effacement (%): Thick Cervical Position: Posterior Station: -3 Presentation: Vertex Exam by:: S Grindstaff RN  Labs: Results for orders placed or performed during the hospital encounter of 08/14/21 (from the past 24 hour(s))  CBC     Status: Abnormal   Collection Time: 08/14/21 10:30 AM  Result Value Ref Range   WBC 11.8 (H) 4.0 - 10.5 K/uL   RBC 4.26 3.87 - 5.11 MIL/uL   Hemoglobin 10.3 (L) 12.0 - 15.0 g/dL   HCT 86.7 (L) 61.9 - 50.9 %   MCV 80.8 80.0 - 100.0 fL   MCH 24.2 (L) 26.0 - 34.0 pg   MCHC 29.9 (L) 30.0 - 36.0 g/dL   RDW 32.6 (H) 71.2 - 45.8 %   Platelets 240 150 - 400 K/uL   nRBC 0.0 0.0 - 0.2 %  RPR     Status: None   Collection Time: 08/14/21 10:30 AM  Result Value Ref Range   RPR Ser Ql NON REACTIVE NON REACTIVE  Type and screen     Status: None   Collection Time: 08/14/21 10:35 AM  Result Value Ref Range   ABO/RH(D) O POS    Antibody Screen NEG    Sample Expiration      08/17/2021,2359 Performed at [facility], [address] [place], [place] [address]     Assessment / Plan: [redacted]w[redacted]d week IUP Labor: Early/IOL Fetal Wellbeing:  Category I Pain Control:  Comfort measures Anticipated MOD:  SVD  Dorathy Kinsman, CNM 08/14/2021 4:05 PM

## 2021-08-15 ENCOUNTER — Encounter (HOSPITAL_COMMUNITY): Payer: Self-pay | Admitting: Obstetrics & Gynecology

## 2021-08-15 DIAGNOSIS — O365930 Maternal care for other known or suspected poor fetal growth, third trimester, not applicable or unspecified: Secondary | ICD-10-CM

## 2021-08-15 DIAGNOSIS — Z30430 Encounter for insertion of intrauterine contraceptive device: Secondary | ICD-10-CM

## 2021-08-15 DIAGNOSIS — Z3A37 37 weeks gestation of pregnancy: Secondary | ICD-10-CM

## 2021-08-15 MED ORDER — ONDANSETRON HCL 4 MG/2ML IJ SOLN: 4.0000 mg | INTRAMUSCULAR | Status: DC | PRN

## 2021-08-15 MED ORDER — IBUPROFEN 600 MG PO TABS
600.0000 mg | ORAL_TABLET | Freq: Four times a day (QID) | ORAL | Status: DC
  Administered 2021-08-15 – 2021-08-16 (×5): 600 mg via ORAL
  Filled 2021-08-15 (×5): qty 1

## 2021-08-15 MED ORDER — DIPHENHYDRAMINE HCL 25 MG PO CAPS: 25.0000 mg | ORAL_CAPSULE | Freq: Four times a day (QID) | ORAL | Status: DC | PRN

## 2021-08-15 MED ORDER — WITCH HAZEL-GLYCERIN EX PADS: 1.0000 "application " | MEDICATED_PAD | CUTANEOUS | Status: DC | PRN

## 2021-08-15 MED ORDER — TETANUS-DIPHTH-ACELL PERTUSSIS 5-2.5-18.5 LF-MCG/0.5 IM SUSY: 0.5000 mL | PREFILLED_SYRINGE | Freq: Once | INTRAMUSCULAR | Status: DC

## 2021-08-15 MED ORDER — SENNOSIDES-DOCUSATE SODIUM 8.6-50 MG PO TABS
2.0000 | ORAL_TABLET | ORAL | Status: DC
  Administered 2021-08-15 – 2021-08-16 (×2): 2 via ORAL
  Filled 2021-08-15 (×2): qty 2

## 2021-08-15 MED ORDER — ACETAMINOPHEN 325 MG PO TABS
650.0000 mg | ORAL_TABLET | ORAL | Status: DC | PRN
  Administered 2021-08-15 – 2021-08-16 (×2): 650 mg via ORAL
  Filled 2021-08-15 (×2): qty 2

## 2021-08-15 MED ORDER — BENZOCAINE-MENTHOL 20-0.5 % EX AERO
1.0000 "application " | INHALATION_SPRAY | CUTANEOUS | Status: DC | PRN
  Administered 2021-08-15: 1 via TOPICAL
  Filled 2021-08-15: qty 56

## 2021-08-15 MED ORDER — PRENATAL MULTIVITAMIN CH
1.0000 | ORAL_TABLET | Freq: Every day | ORAL | Status: DC
  Administered 2021-08-15 – 2021-08-16 (×2): 1 via ORAL
  Filled 2021-08-15 (×2): qty 1

## 2021-08-15 MED ORDER — DIBUCAINE (PERIANAL) 1 % EX OINT: 1.0000 "application " | TOPICAL_OINTMENT | CUTANEOUS | Status: DC | PRN

## 2021-08-15 MED ORDER — ZOLPIDEM TARTRATE 5 MG PO TABS: 5.0000 mg | ORAL_TABLET | Freq: Every evening | ORAL | Status: DC | PRN

## 2021-08-15 MED ORDER — ONDANSETRON HCL 4 MG PO TABS: 4.0000 mg | ORAL_TABLET | ORAL | Status: DC | PRN

## 2021-08-15 MED ORDER — SIMETHICONE 80 MG PO CHEW: 80.0000 mg | CHEWABLE_TABLET | ORAL | Status: DC | PRN

## 2021-08-15 MED ORDER — IBUPROFEN 600 MG PO TABS
600.0000 mg | ORAL_TABLET | Freq: Once | ORAL | Status: AC
  Administered 2021-08-15: 600 mg via ORAL
  Filled 2021-08-15: qty 1

## 2021-08-15 MED ORDER — COCONUT OIL OIL: 1.0000 "application " | TOPICAL_OIL | Status: DC | PRN

## 2021-08-15 NOTE — Anesthesia Postprocedure Evaluation (Signed)
Anesthesia Post Note  Patient: Olivia Gardner  Procedure(s) Performed: AN AD HOC LABOR EPIDURAL     Patient location during evaluation: Mother Baby Anesthesia Type: Epidural Level of consciousness: awake and alert Pain management: pain level controlled Vital Signs Assessment: post-procedure vital signs reviewed and stable Respiratory status: spontaneous breathing, nonlabored ventilation and respiratory function stable Cardiovascular status: stable Postop Assessment: no headache, no backache and epidural receding Anesthetic complications: no   No notable events documented.  Last Vitals:  Vitals:   08/15/21 0516 08/15/21 0623  BP: 130/80 121/75  Pulse: 86 82  Resp:  18  Temp:  36.7 C    Last Pain:  Vitals:   08/15/21 0623  TempSrc: Oral  PainSc:    Pain Goal:                   [name]

## 2021-08-15 NOTE — Lactation Note (Signed)
This note was copied from a baby's chart. Lactation Consultation Note  Patient Name: Girl DANIELA SIEBERS ZSMOL'M Date: 08/15/2021 Reason for consult: Follow-up assessment;Early term 37-38.6wks;Infant < 6lbs Age:32 hours  Feeding attempted, infant did not successfully latch, but did lick colostrum off of Mom's nipple that we had hand expressed. Bottle of DBM (with small amount of colostrum) given to infant. Extra slow-flow nipple seemed too fast (harder swallows noted); Nfant Slow flow nipple used, instead. RN given update.    Lurline Hare [facility] 08/15/2021, 9:50 AM

## 2021-08-15 NOTE — Discharge Summary (Signed)
Postpartum Discharge Summary     Patient Name: Olivia Gardner DOB: Feb 03, 1991 MRN: [id number]  Date of admission: 08/14/2021 Delivery date:08/15/2021  Delivering provider: Gladys Gardner  Date of discharge: 08/16/2021  Admitting diagnosis: Poor fetal growth [P05.9] Intrauterine pregnancy: [redacted]w[redacted]d    Secondary diagnosis:  Principal Problem:   Vaginal delivery Active Problems:   Poor fetal growth   Encounter for IUD insertion  Additional problems: N/A    Discharge diagnosis:  IUGR                                               Post partum procedures: Manual removal of trailing membranes, postplacental IUD placement Augmentation: AROM, Cytotec, and IP Foley Complications: None  Hospital course: Induction of Labor With Vaginal Delivery   29y.o. yo G2P1001 at 362w6das admitted to the hospital 08/14/2021 for induction of labor.  Indication for induction:  FGR .  Patient had an uncomplicated labor course as follows: Membrane Rupture Time/Date:  Delivery Method:Vaginal, Spontaneous  Episiotomy: None  Lacerations:  1st degree  Details of delivery can be found in separate delivery note.  Patient had a routine postpartum course. Patient is discharged home 08/16/21.  Newborn Data: Birth date:08/15/2021  Birth time:3:22 AM  Gender:Female  Living status:Living  Apgars:9 ,9  Weight:2240 g   Magnesium Sulfate received: No BMZ received: No Rhophylac: N/A MMR: N/A T-DaP: Given prenatally Flu: N/A Transfusion: No  Physical exam  Vitals:   08/15/21 1206 08/15/21 1553 08/15/21 2018 08/16/21 0610  BP: 122/74 90/71 113/71 112/83  Pulse: 71 68 81 75  Resp: _0 Temp: 98 F (36.7 C) 98.6 F (37 C) 98.6 F (37 C) 98.4 F (36.9 C)  TempSrc: Oral  Oral Oral  SpO2: 98% 99% 98% 100%  Weight:      Height:       General: alert, cooperative, and no distress Lochia: appropriate Uterine Fundus: firm DVT Evaluation: no LE edema or calf tenderness to  palpation  Labs: Lab Results  Component Value Date   WBC 11.8 (H) 08/14/2021   HGB 10.3 (L) 08/14/2021   HCT 34.4 (L) 08/14/2021   MCV 80.8 08/14/2021   PLT 240 08/14/2021   CMP Latest Ref Rng & Units 04/01/2021  Glucose 65 - 99 mg/dL 92  BUN 7 - 25 mg/dL 10  Creatinine 0.50 - 1.10 mg/dL 0.45(L)  Sodium 135 - 146 mmol/L 136  Potassium 3.5 - 5.3 mmol/L 4.0  Chloride 98 - 110 mmol/L 102  CO2 20 - 32 mmol/L 24  Calcium 8.6 - 10.2 mg/dL 8.9  Total Protein 6.1 - 8.1 g/dL 6.9  Total Bilirubin 0.2 - 1.2 mg/dL 0.4  Alkaline Phos 38 - 126 U/L -  AST 10 - 30 U/L 14  ALT 6 - 29 U/L 7   Edinburgh Score: Edinburgh Postnatal Depression Scale Screening Tool 05/14/2019  I have been able to laugh and see the funny side of things. 0  I have looked forward with enjoyment to things. 0  I have blamed myself unnecessarily when things went wrong. 1  I have been anxious or worried for no good reason. 1  I have felt scared or panicky for no good reason. 0  Things have been getting on top of me. 0  I have been so unhappy that I have had difficulty sleeping.  0  I have felt sad or miserable. 0  I have been so unhappy that I have been crying. 0  The thought of harming myself has occurred to me. 0  Edinburgh Postnatal Depression Scale Total 2     After visit meds:  Allergies as of 08/16/2021       Reactions   Sulfacetamide Sodium Other (See Comments)   Per Patient caused damage to her eye to the point she has to get new prescription lenses.  Her eye doctor wanted it noted she can not take these any more.        Medication List     STOP taking these medications    nystatin cream Commonly known as: MYCOSTATIN       TAKE these medications    acetaminophen 500 MG tablet Commonly known as: TYLENOL Take 2 tablets (1,000 mg total) by mouth every 8 (eight) hours as needed (pain). What changed:  how much to take when to take this reasons to take this   cetirizine 10 MG tablet Commonly  known as: ZYRTEC Take 10 mg by mouth daily.   ferrous sulfate 325 (65 FE) MG tablet Commonly known as: FerrouSul Take 1 tablet (325 mg total) by mouth every other day. What changed: when to take this   ibuprofen 200 MG tablet Commonly known as: ADVIL Take 4 tablets (800 mg total) by mouth every 8 (eight) hours as needed (pain).   One-A-Day Womens Prenatal 1 28-0.8-235 MG Caps   polyethylene glycol 17 g packet Commonly known as: MIRALAX / GLYCOLAX Take 17 g by mouth daily.   Vitamin D 50 MCG (2000 UT) tablet       Discharge home in stable condition Infant Feeding: Breast Infant Disposition: home with mother Discharge instruction: per After Visit Summary and Postpartum booklet. Activity: Advance as tolerated. Pelvic rest for 6 weeks.  Diet: routine diet Future Appointments: Future Appointments  Date Time Provider Department Center  09/07/2021 10:10 AM Leftwich-Kirby, Lisa A, CNM [facility]   Follow up Visit: Postpartum appointment request sent by Sam Weinhold, CNM on 08/15/2021  Please schedule this patient for a In person postpartum visit in 4 weeks with the following provider: Any provider. Additional Postpartum F/U: Trim IUD strings   High risk pregnancy complicated by:  FGR Delivery mode:  Vaginal, Spontaneous  Anticipated Birth Control:  PP IUD placed   Olivia Albert, MD OB Fellow, [facility] [place], [facility] 08/16/2021 2:16 PM     

## 2021-08-15 NOTE — Lactation Note (Signed)
This note was copied from a baby's chart. Lactation Consultation Note  Patient Name: Olivia Gardner Date: 08/15/2021 Reason for consult: Follow-up assessment;Early term 37-38.6wks;Infant < 6lbs Age:32 hours  Infant recently fed, but Mom was shown how to get in position to attempt to latch while laying on her side. Hand expression was reviewed with Mom as were the specifics of getting an asymmetric latch (shown via [employer]).   Mom prefers to use her own Spectra pump while here, but also has a hand pump.   Lurline Hare [facility] 08/15/2021, 3:02 PM

## 2021-08-15 NOTE — Discharge Instructions (Signed)

## 2021-08-15 NOTE — Procedures (Signed)
    Post-Placental IUD Insertion Procedure Note  Patient identified, informed consent signed prior to delivery, signed copy in chart, time out was performed.    Vaginal, labial and perineal areas thoroughly inspected for lacerations. 1st degree laceration identified - not hemostatic,repaired prior to insertion of Liletta IUD.   - IUD inserted with inserter per manufacturer's instructions.    Strings trimmed to the level of the introitus. Patient tolerated procedure well.   Patient given post procedure instructions and IUD care card with expiration date.  Patient is asked to keep IUD strings tucked in her vagina until her postpartum follow up visit in 4-6 weeks. Patient advised to abstain from sexual intercourse and pulling on strings before her follow-up visit. Patient verbalized an understanding of the plan of care and agrees.   Clayton Bibles, MSN, CNM Certified Nurse Midwife, [employer] for [employer], [facility] 08/15/21 4:32 AM

## 2021-08-15 NOTE — Lactation Note (Signed)
This note was copied from a baby's chart. Lactation Consultation Note  Patient Name: Olivia Gardner Date: 08/15/2021 Reason for consult: Initial assessment;Early term 37-38.6wks;Infant < 6lbs;Maternal endocrine disorder Age:32 hours  Mom is a P2 who attempted to nurse her 1st child for 8 weeks. Mom reports that 1st child had a tongue-tie, (not revised). Mom had low milk supply, only able to pump a total of 2-4 oz per day. Mom also had postpartum thyroiditis.   "[name] Violet" was given formula earlier for low blood sugar. Infant is currently sleeping skin-to-skin on Mom. I asked parents to call for me when infant begins cueing.   Consent for [facility] signed in anticipation of needing to supplement during inpatient stay.   Lurline Hare [facility] 08/15/2021, 8:47 AM

## 2021-08-16 MED ORDER — ACETAMINOPHEN 500 MG PO TABS: 1000.0000 mg | ORAL_TABLET | Freq: Three times a day (TID) | ORAL | 0 refills | Status: DC | PRN

## 2021-08-16 MED ORDER — IBUPROFEN 200 MG PO TABS: 800.0000 mg | ORAL_TABLET | Freq: Three times a day (TID) | ORAL | Status: DC | PRN

## 2021-08-16 NOTE — Lactation Note (Signed)
This note was copied from a baby's chart. Lactation Consultation Note  Patient Name: Olivia Gardner Date: 08/16/2021 Reason for consult: Follow-up assessment;Early term 37-38.6wks;Infant < 6lbs Age:32 hours  Mom assisted with latch; infant had good swallows (suck:swallow ratio 1:1) on one breast, but could not always maintain latch. On the other breast, infant was able to sustain latch continuously, but less swallows were noted (verified by cervical auscultation). Parents encouraged to watch baby's behavior to determine breast management.   When infant was being supplemented with the bottle (extra slow-flow nipple), I noted that infant's swallows sounded more like gulps; there was milk leaking from sides of mouth; and infant seemed hesitant to create a good seal on the bottle teat. We returned to the Nfant slow flow nipple & infant did better. RN made aware.  Byrd Hesselbach, SLP called & she will come for the next bottle feeding.  Lurline Hare [facility] 08/16/2021, 12:22 PM

## 2021-08-16 NOTE — Lactation Note (Signed)
This note was copied from a baby's chart. Lactation Consultation Note  Patient Name: Olivia Gardner Date: 08/16/2021 Reason for consult: Follow-up assessment;Early term 37-38.6wks;Infant < 6lbs Age:32 hours  Parents preparing for d/c. They have no other questions for me. Parents will supplement with bottle after breast, feeding [name] until she is content. Per parent report, parents will be using Nfant Slow Flow nipple or Dr. Theora Gianotti Preemie nipple at home. Parents thankful for the time spent with them during their inpatient stay.   Lurline Hare [facility] 08/16/2021, 2:32 PM

## 2021-08-16 NOTE — Progress Notes (Signed)
POSTPARTUM PROGRESS NOTE  Subjective: Olivia Gardner is a 30 y.o. [id number] s/p SVD at [redacted]w[redacted]d.  She reports she doing well. No acute events overnight. She denies any problems with ambulating, voiding or po intake. Denies nausea or vomiting. She has passed flatus. Pain is well controlled.  Lochia is normal.  Objective: Blood pressure 112/83, pulse 75, temperature 98.4 F (36.9 C), temperature source Oral, resp. rate 18, height 5' 0.5" (1.537 m), weight 71.7 kg, last menstrual period 11/23/2020, SpO2 100 %, unknown if currently breastfeeding.  Physical Exam:  General: alert, cooperative and no distress Chest: no respiratory distress Abdomen: soft, non-tender  Uterine Fundus: firm and at level of umbilicus Extremities: No calf swelling or tenderness  no edema  Recent Labs    08/14/21 1030  HGB 10.3*  HCT 34.4*    Assessment/Plan: NIKKA HAKIMIAN is a 30 y.o. [id number] s/p SVD at [redacted]w[redacted]d for IOL for IUGR.  Routine Postpartum Care: Doing well, pain well-controlled.  -- Continue routine care, lactation support  -- Contraception: PP IUD -- Feeding: breast  Dispo: Plan for discharge tomorrow.  Jovita Kussmaul, MD 08/16/2021 8:55 AM

## 2021-08-16 NOTE — Lactation Note (Signed)
This note was copied from a baby's chart. Lactation Consultation Note  Patient Name: [name] [id number] Date: 08/16/2021   Age:32 hours  Parents & RN report that infant did very well with the extra-slow flow nipple.   Feedings have improved since yesterday. Massage on abdomen discussed to help infant with stooling.  Lurline Hare [facility] 08/16/2021, 10:51 AM

## 2021-08-17 ENCOUNTER — Telehealth: Payer: Self-pay

## 2021-08-17 LAB — SURGICAL PATHOLOGY

## 2021-08-17 NOTE — Telephone Encounter (Signed)
Transition Care Management Follow-up Telephone Call Date of discharge and from where: 08/16/2021 from [facility]  How have you been since you were released from the hospital? Pt stated that she was feeling well and did not have any questions or concerns at this time.  Any questions or concerns? No  Items Reviewed: Did the pt receive and understand the discharge instructions provided? Yes  Medications obtained and verified? Yes  Other? No  Any new allergies since your discharge? No  Dietary orders reviewed? No Do you have support at home? Yes   Functional Questionnaire: (I = Independent and D = Dependent) ADLs: I  Bathing/Dressing- I  Meal Prep- I  Eating- I  Maintaining continence- I  Transferring/Ambulation- I  Managing Meds- I   Follow up appointments reviewed:  PCP Hospital f/u appt confirmed? No   Specialist Hospital f/u appt confirmed? Yes  Scheduled to see Sharen Counter, CNM on 09/10/2021 @ 10:10am. Are transportation arrangements needed? No  If their condition worsens, is the pt aware to call PCP or go to the Emergency Dept.? Yes Was the patient provided with contact information for the PCP's office or ED? Yes Was to pt encouraged to call back with questions or concerns? Yes

## 2021-08-23 ENCOUNTER — Ambulatory Visit: Payer: 59 | Admitting: Obstetrics & Gynecology

## 2021-08-25 ENCOUNTER — Telehealth (HOSPITAL_COMMUNITY): Payer: Self-pay | Admitting: *Deleted

## 2021-08-25 NOTE — Telephone Encounter (Signed)
Hospital Discharge Follow-Up Call:  Patient reports that she is doing well overall and has no concerns about her health.  EPDS today was 10.  Patient feels most of her stressors are due to her work situation.  She already has an appointment with a therapist for next Thursday, September 8 at 1:00pm.  She says that baby is doing well.  They are working through some breastfeeding issues, but are supplementing with EBM and formula to meet baby's nutritional needs.  She has no concerns about baby's health.  Patient reports that baby sleeps in a bassinet beside her bed.  She is familiar with ABCs of Safe Sleep.

## 2021-08-30 ENCOUNTER — Inpatient Hospital Stay (HOSPITAL_COMMUNITY): Admit: 2021-08-30 | Payer: Self-pay

## 2021-09-07 ENCOUNTER — Ambulatory Visit (INDEPENDENT_AMBULATORY_CARE_PROVIDER_SITE_OTHER): Payer: 59 | Admitting: Advanced Practice Midwife

## 2021-09-07 ENCOUNTER — Other Ambulatory Visit: Payer: Self-pay

## 2021-09-07 ENCOUNTER — Encounter: Payer: Self-pay | Admitting: Advanced Practice Midwife

## 2021-09-07 DIAGNOSIS — Z30430 Encounter for insertion of intrauterine contraceptive device: Secondary | ICD-10-CM | POA: Diagnosis not present

## 2021-09-07 DIAGNOSIS — R102 Pelvic and perineal pain: Secondary | ICD-10-CM

## 2021-09-07 DIAGNOSIS — Z975 Presence of (intrauterine) contraceptive device: Secondary | ICD-10-CM | POA: Insufficient documentation

## 2021-09-07 DIAGNOSIS — T8339XA Other mechanical complication of intrauterine contraceptive device, initial encounter: Secondary | ICD-10-CM

## 2021-09-07 LAB — POCT URINE PREGNANCY: Preg Test, Ur: NEGATIVE

## 2021-09-07 MED ORDER — LEVONORGESTREL 20 MCG/DAY IU IUD: 1.0000 | INTRAUTERINE_SYSTEM | Freq: Once | INTRAUTERINE | Status: DC

## 2021-09-07 MED ORDER — LEVONORGESTREL 20.1 MCG/DAY IU IUD
1.0000 | INTRAUTERINE_SYSTEM | Freq: Once | INTRAUTERINE | Status: AC
  Administered 2021-09-07: 1 via INTRAUTERINE

## 2021-09-07 NOTE — Progress Notes (Addendum)
Post Partum Visit Note  Olivia Gardner is a 30 y.o. G79P2002 female who presents for a postpartum visit. She is 3 weeks postpartum following a normal spontaneous vaginal delivery.  I have fully reviewed the prenatal and intrapartum course. The delivery was at 37.6 gestational weeks.  Anesthesia: epidural. Postpartum course has been unremarkable. Baby is doing well. Baby is feeding by breast and formula feeding.Bleeding no bleeding. Bowel function is abnormal: Pt does use Miralax . Bladder function is normal. Patient is not sexually active. Contraception method is IUD. Postpartum depression screening: negative.   The pregnancy intention screening data noted above was reviewed. Potential methods of contraception were discussed. The patient elected to proceed with No data recorded.   Edinburgh Postnatal Depression Scale - 09/07/21 1037       Edinburgh Postnatal Depression Scale:  In the Past 7 Days   I have been able to laugh and see the funny side of things. 0    I have looked forward with enjoyment to things. 0    I have blamed myself unnecessarily when things went wrong. 2    I have been anxious or worried for no good reason. 0    I have felt scared or panicky for no good reason. 2    Things have been getting on top of me. 1    I have been so unhappy that I have had difficulty sleeping. 0    I have felt sad or miserable. 0    I have been so unhappy that I have been crying. 0    The thought of harming myself has occurred to me. 0    Edinburgh Postnatal Depression Scale Total 5             Health Maintenance Due  Topic Date Due   COVID-19 Vaccine (3 - Booster for Pfizer series) 08/17/2020   INFLUENZA VACCINE  07/26/2021    The following portions of the patient's history were reviewed and updated as appropriate: allergies, current medications, past family history, past medical history, past social history, past surgical history, and problem list.  Review of  Systems Pertinent items noted in HPI and remainder of comprehensive ROS otherwise negative.  Objective:  BP 114/70   Pulse 82   Resp 16   Ht 5' 0.5" (1.537 m)   Wt 140 lb (63.5 kg)   LMP 11/23/2020   BMI 26.89 kg/m    VS reviewed, nursing note reviewed,  Constitutional: well developed, well nourished, no distress HEENT: normocephalic CV: normal rate Pulm/chest wall: normal effort Breast Exam:  Deferred Abdomen: soft Neuro: alert and oriented x 3 Skin: warm, dry Psych: affect normal Pelvic exam: Cervix pink, visually closed, without lesion, scant white creamy discharge, vaginal walls and external genitalia normal, sutures visible, laceration well approximated, no edema or erythema   IUD Procedure Note Patient identified, informed consent performed.  Discussed risks of irregular bleeding, cramping, infection, malpositioning or misplacement of the IUD outside the uterus which may require further procedures. Time out was performed.  Urine pregnancy test negative.  Speculum placed in the vagina.  Cervix visualized.  Cleaned with Betadine x 2.  Grasped anteriorly with a single tooth tenaculum.  Uterus sounded to 7 cm.  Liletta IUD placed per manufacturer's recommendations.  Strings trimmed to 3 cm. Tenaculum was removed, good hemostasis noted.  Patient tolerated procedure well.   Patient was given post-procedure instructions and the Liletta care card with expiration date.  Patient was also asked to check  IUD strings periodically and follow up in 4-6 weeks for IUD check.   Assessment:   1. 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  Considering new job and/or going back to school.  - POCT urine pregnancy - levonorgestrel (LILETTA) 20.1 MCG/DAY IUD 1 each  2. Encounter for IUD  insertion --Liletta IUD was placed postplacental during hospital admission but was expelled at home 2 weeks postpartum.  Pt desires IUD replacement so Liletta placed today without difficulty. See note above.  3. Pelvic pain in female --Pt with pelvic floor problems since first pregnancy, has seen chiropractor, improvement with incontinence but still pelvic/back pain.  Pt with some pelvic pain after this pregnancy and would benefit from formal pelvic floor work.  - Ambulatory referral to Physical Therapy   Normal postpartum exam.   Plan:   Essential components of care per ACOG recommendations:  1.  Mood and well being: Patient with negative depression screening today. Reviewed local resources for support.  - Patient tobacco use? No.   - hx of drug use? No.    2. Infant care and feeding:  -Patient currently breastmilk feeding? Yes. Discussed returning to work and pumping. Reviewed importance of draining breast regularly to support lactation.  -Social determinants of health (SDOH) reviewed in EPIC. No concerns  3. Sexuality, contraception and birth spacing - Patient does not want a pregnancy in the next year.  - Reviewed forms of contraception in tiered fashion. Patient desired IUD today.   - Discussed birth spacing of 18 months  4. Sleep and fatigue -Encouraged family/partner/community support of 4 hrs of uninterrupted sleep to help with mood and fatigue  5. Physical Recovery  - Discussed patients delivery and complications. She describes her labor as good. - Patient had a Vaginal, no problems at delivery. Patient had a 1st degree laceration. Perineal healing reviewed. Patient expressed understanding - Patient has urinary incontinence? Yes. Offered PT and patient accepted. Patient was referred to pelvic floor PT.  - Patient is safe to resume physical and sexual activity  6.  Health Maintenance - HM due items addressed Yes - Last pap smear  Diagnosis  Date Value Ref Range Status   06/09/2020   Final   - Negative for intraepithelial lesion or malignancy (NILM)   Pap smear not done at today's visit.  -Breast Cancer screening indicated? No.   7. Chronic Disease/Pregnancy Condition follow up: None  - PCP follow up  Sharen Counter, CNM Center for [employer], [facility]

## 2021-10-05 ENCOUNTER — Encounter: Payer: Self-pay | Admitting: Advanced Practice Midwife

## 2021-10-05 ENCOUNTER — Ambulatory Visit (INDEPENDENT_AMBULATORY_CARE_PROVIDER_SITE_OTHER): Payer: 59 | Admitting: Advanced Practice Midwife

## 2021-10-05 ENCOUNTER — Other Ambulatory Visit: Payer: Self-pay

## 2021-10-05 VITALS — BP 107/66 | HR 87 | Resp 16 | Ht 60.5 in | Wt 140.0 lb

## 2021-10-05 DIAGNOSIS — N921 Excessive and frequent menstruation with irregular cycle: Secondary | ICD-10-CM | POA: Diagnosis not present

## 2021-10-05 DIAGNOSIS — F4329 Adjustment disorder with other symptoms: Secondary | ICD-10-CM

## 2021-10-05 DIAGNOSIS — R102 Pelvic and perineal pain: Secondary | ICD-10-CM

## 2021-10-05 DIAGNOSIS — Z975 Presence of (intrauterine) contraceptive device: Secondary | ICD-10-CM

## 2021-10-05 DIAGNOSIS — Z23 Encounter for immunization: Secondary | ICD-10-CM

## 2021-10-05 MED ORDER — MEDROXYPROGESTERONE ACETATE 10 MG PO TABS: 10.0000 mg | ORAL_TABLET | Freq: Every day | ORAL | 2 refills | Status: DC

## 2021-10-05 NOTE — Progress Notes (Signed)
   GYNECOLOGY PROGRESS NOTE  History:  30 y.o. [id number] presents to [facility] office today for follow up gyn/IUD check up visit. She had postpartum visit on 09/07/21 following vaginal delivery on 08/15/21. Liletta was placed immediately postplacental but was expelled and was replaced on 09/07/21.  She also reported chronic pelvic pain following her previous vaginal delivery and physical therapy referral was placed on 09/07/21.  Pt has not scheduled PT yet.    She reports frequent light vaginal bleeding, otherwise no problems with IUD.  She denies h/a, dizziness, shortness of breath, n/v, or fever/chills.    The following portions of the patient's history were reviewed and updated as appropriate: allergies, current medications, past family history, past medical history, past social history, past surgical history and problem list. Last pap smear on 06/09/2020 was normal, neg HRHPV.  Health Maintenance Due  Topic Date Due   COVID-19 Vaccine (3 - Booster for Pfizer series) 08/17/2020     Review of Systems:  Pertinent items are noted in HPI.   Objective:  Physical Exam Blood pressure 107/66, pulse 87, resp. rate 16, height 5' 0.5" (1.537 m), weight 140 lb (63.5 kg), last menstrual period 11/23/2020, unknown if currently breastfeeding. VS reviewed, nursing note reviewed,  Constitutional: well developed, well nourished, no distress HEENT: normocephalic CV: normal rate Pulm/chest wall: normal effort Breast Exam: deferred Abdomen: soft Neuro: alert and oriented x 3 Skin: warm, dry Psych: affect normal Pelvic exam: Cervix pink, visually closed, without lesion, IUD strings visible, ~ 3 cm in length from cervical os, scant white creamy discharge, vaginal walls and external genitalia normal   Assessment & Plan:  1. Breakthrough bleeding associated with intrauterine device (IUD) --Pt breastfeeding (and supplementing) so avoid estrogen which may decrease milk supply.  Try Provera x 10 days,  encourage light period. See if frequent bleeding will stop.  May repeat as needed. --F/U in office if frequent bleeding persists - medroxyPROGESTERone (PROVERA) 10 MG tablet; Take 1 tablet (10 mg total) by mouth daily.  Dispense: 10 tablet; Refill: 2  2. Pelvic pain in female --Chronic, unchanged from previous pregnancy --Referral order updated, pt to follow up with PT  3. Stress and adjustment reaction --Pt overall doing well.  Has job stress and had hoped to change jobs or go back to school but it is not in the budget at this time --Reports more difficulty adjusting to second baby than first, trying to potty train oldest child, new baby likes to be carried, not put down in bassinet/bouncy chair.   --Has good support at home, husband does night duty feeding etc. --Starting to get more sleep which helps --Lean on support, follow up with [place] as needed  Sharen Counter, CNM 9:53 AM

## 2021-10-29 ENCOUNTER — Other Ambulatory Visit (HOSPITAL_COMMUNITY)
Admission: RE | Admit: 2021-10-29 | Discharge: 2021-10-29 | Disposition: A | Discharge status: Home / Self Care | Payer: 59 | Source: Ambulatory Visit | Attending: Certified Nurse Midwife | Admitting: Certified Nurse Midwife

## 2021-10-29 ENCOUNTER — Ambulatory Visit: Payer: 59 | Admitting: Certified Nurse Midwife

## 2021-10-29 ENCOUNTER — Other Ambulatory Visit: Payer: Self-pay

## 2021-10-29 ENCOUNTER — Encounter: Payer: Self-pay | Admitting: Certified Nurse Midwife

## 2021-10-29 VITALS — BP 101/70 | HR 76 | Ht 60.0 in | Wt 139.0 lb

## 2021-10-29 DIAGNOSIS — N898 Other specified noninflammatory disorders of vagina: Secondary | ICD-10-CM | POA: Diagnosis not present

## 2021-10-29 NOTE — Progress Notes (Signed)
   GYNECOLOGY OFFICE VISIT NOTE  History:  30 y.o. [id number] here today for vaginal irritation and discharge. Reports watery, malodorous discharge earlier this week. Sx have improved in the last few days. Denies new detergents or skin products. No new partner. No concern for STDs. Not currently sexually active. She is changing her underwear more often and showering daily which has helped. Had Liletta IUD placed 2 mos ago and was taking progesterone for BTB.   Past Medical History:  Diagnosis Date   Abnormal weight gain 06/25/2019   Hyperlipidemia    Vitamin D deficiency     Past Surgical History:  Procedure Laterality Date   WISDOM TOOTH EXTRACTION      The following portions of the patient's history were reviewed and updated as appropriate: allergies, current medications, past family history, past medical history, past social history, past surgical history and problem list.   Health Maintenance:  Normal pap and negative HRHPV on 06/09/20.    Review of Systems:  Negative except noted in HPI Genito-Urinary ROS: positive for - genital discharge and vulvar/vaginal symptoms negative for - pelvic pain or urinary frequency/urgency  Objective:  Physical Exam BP 101/70   Pulse 76   Ht 5' (1.524 m)   Wt 139 lb (63 kg)   LMP 11/23/2020   BMI 27.15 kg/m  CONSTITUTIONAL: Well-developed, well-nourished female in no acute distress.  HENT:  Normocephalic, atraumatic EYES: Conjunctivae and EOM are normal NECK: Normal range of motion SKIN: Skin is warm and dry NEUROLOGIC: Alert and oriented to person, place, and time PSYCHIATRIC: Normal mood and affect CARDIOVASCULAR: Normal heart rate noted RESPIRATORY: Effort and rate normal PELVIC: Normal appearing external genitalia; normal appearing vaginal mucosa and cervix.  No abnormal discharge noted.  Normal uterine size, no other palpable masses, no uterine or adnexal tenderness.IUD strings seen MUSCULOSKELETAL: Normal range of motion  Labs and  Imaging No results found for this or any previous visit (from the past 24 hour(s)).  Assessment & Plan:   1. Vaginal discharge   2. Vaginal irritation   -Aptima swab  Follow up as scheduled   I spent 10 minutes dedicated to the care of this patient including pre-visit review of records, face to face time with the patient discussing her conditions and treatments and post visit ordering of testing.  Donette Larry, CNM 10/29/2021 10:57 AM

## 2021-11-02 LAB — CERVICOVAGINAL ANCILLARY ONLY
Bacterial Vaginitis (gardnerella): NEGATIVE
Candida Glabrata: NEGATIVE
Candida Vaginitis: NEGATIVE
Comment: NEGATIVE
Comment: NEGATIVE
Comment: NEGATIVE

## 2021-12-04 ENCOUNTER — Emergency Department
Admission: EM | Admit: 2021-12-04 | Discharge: 2021-12-04 | Disposition: A | Discharge status: Home / Self Care | Payer: 59 | Source: Home / Self Care | Attending: Family Medicine | Admitting: Family Medicine

## 2021-12-04 ENCOUNTER — Other Ambulatory Visit: Payer: Self-pay

## 2021-12-04 ENCOUNTER — Encounter: Payer: Self-pay | Admitting: Emergency Medicine

## 2021-12-04 DIAGNOSIS — U071 COVID-19: Secondary | ICD-10-CM | POA: Diagnosis not present

## 2021-12-04 HISTORY — DX: COVID-19: U07.1

## 2021-12-04 LAB — POCT INFLUENZA A/B
Influenza A, POC: NEGATIVE
Influenza B, POC: NEGATIVE

## 2021-12-04 MED ORDER — NIRMATRELVIR/RITONAVIR (PAXLOVID)TABLET: 3.0000 | ORAL_TABLET | Freq: Two times a day (BID) | ORAL | 0 refills | Status: AC

## 2021-12-04 NOTE — ED Triage Notes (Signed)
Pt is COVID positive as of today  Symptoms started on Thursday  Pt has a 86 month old and a 32 year old at home  Pt was interested in antivirals to preven t spread of COVID  at home

## 2021-12-04 NOTE — ED Provider Notes (Signed)
[facility]    CSN: [id number] Arrival date & time: 12/04/21  [id number]      History   Chief Complaint Chief Complaint  Patient presents with   Covid Positive    HPI Olivia Gardner is a 30 y.o. female.   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  She also has 2 young children at home who are in daycare.  She states that she has had symptoms starting on Thursday with a lot of sinus congestion pain, postnasal drip, sore throat, and slight cough.  She also has some fatigue and body aches.  She has had symptoms for about 72 hours.  She states that she tested positive for COVID as of this morning.  She is here requesting antiviral therapy.  Past Medical History:  Diagnosis Date   Abnormal weight gain 06/25/2019   COVID-19 12/04/2021   Hyperlipidemia    Vitamin D deficiency     Patient Active Problem List   Diagnosis Date Noted   IUD (intrauterine device) in place 09/07/2021   Pica in adults 07/31/2020   Elevated TSH 07/31/2020   Other dietary vitamin B12 deficiency anemia 09/24/2019   Post partum thyroiditis 06/26/2019   Vitamin D deficiency 03/28/2016   Low iron stores 03/28/2016   Hyperlipidemia 03/25/2016   Ganglion of right wrist 03/25/2016   Epicondylitis elbow, medial 02/02/2015   Elevated LDL cholesterol level 01/30/2015   Tinnitus 01/30/2015    Past Surgical History:  Procedure Laterality Date   WISDOM TOOTH EXTRACTION      OB History     Gravida  2   Para  2   Term  2   Preterm      AB      Living  2      SAB      IAB      Ectopic      Multiple  0   Live Births  2            Home Medications    Prior to Admission medications   Medication Sig Start Date End Date Taking? Authorizing Provider  nirmatrelvir/ritonavir EUA (PAXLOVID) 20 x 150 MG & 10 x 100MG  TABS Take 3 tablets by mouth 2 (two) times daily for 5 days. Patient GFR is over 60.  Take nirmatrelvir (150 mg) two tablets twice daily for 5 days and  ritonavir (100 mg) one tablet twice daily for 5 days. 12/04/21 12/09/21 Yes 12/11/21, MD  Levonorgestrel (LILETTA, 52 MG, IU) by Intrauterine route.    [provider]    Family History Family History  Problem Relation Age of Onset   Hyperlipidemia Mother    Hypertension Father    Diabetes Maternal Grandmother    Diabetes Paternal Grandmother    Asthma Neg Hx    Stroke Neg Hx     Social History Social History   Tobacco Use   Smoking status: Never   Smokeless tobacco: Never  Vaping Use   Vaping Use: Never used  Substance Use Topics   Alcohol use: Not Currently    Alcohol/week: 0.0 standard drinks   Drug use: No     Allergies   Sulfacetamide sodium   Review of Systems Review of Systems See HPI  Physical Exam Triage Vital Signs ED Triage Vitals  Enc Vitals Group     BP 12/04/21 0854 114/80     Pulse Rate 12/04/21 0854 (!) 110     Resp 12/04/21 0854  16     Temp 12/04/21 0854 99 F (37.2 C)     Temp Source 12/04/21 0854 Oral     SpO2 12/04/21 0854 98 %     Weight 12/04/21 0857 137 lb (62.1 kg)     Height 12/04/21 0857 5' (1.524 m)     Head Circumference --      Peak Flow --      Pain Score 12/04/21 0857 3     Pain Loc --      Pain Edu? --      Excl. in GC? --    No data found.  Updated Vital Signs BP 114/80 (BP Location: Left Arm)   Pulse (!) 110   Temp 99 F (37.2 C) (Oral)   Resp 16   Ht 5' (1.524 m)   Wt 62.1 kg   SpO2 98%   Breastfeeding No   BMI 26.76 kg/m      Physical Exam Constitutional:      General: She is not in acute distress.    Appearance: She is well-developed.  HENT:     Head: Normocephalic and atraumatic.  Eyes:     Conjunctiva/sclera: Conjunctivae normal.     Pupils: Pupils are equal, round, and reactive to light.  Cardiovascular:     Rate and Rhythm: Regular rhythm. Tachycardia present.     Heart sounds: Normal heart sounds.  Pulmonary:     Effort: Pulmonary effort is normal. No respiratory  distress.     Breath sounds: Normal breath sounds.  Abdominal:     General: There is no distension.     Palpations: Abdomen is soft.  Musculoskeletal:        General: Normal range of motion.     Cervical back: Normal range of motion.  Skin:    General: Skin is warm and dry.  Neurological:     Mental Status: She is alert.     UC Treatments / Results  Labs (all labs ordered are listed, but only abnormal results are displayed) Labs Reviewed  POCT INFLUENZA A/B    EKG   Radiology No results found.  Procedures Procedures (including critical care time)  Medications Ordered in UC Medications - No data to display  Initial Impression / Assessment and Plan / UC Course  I have reviewed the triage vital signs and the nursing notes.  Pertinent labs & imaging results that were available during my care of the patient were reviewed by me and considered in my medical decision making (see chart for details).     Influenza test is negative (patient request).  COVID-19 positive.  Discussed quarantine and treatment. Final Clinical Impressions(s) / UC Diagnoses   Final diagnoses:  COVID-19     Discharge Instructions      I am prescribing paxlovid.  Take 1 dose twice a day for 5 days.  Take 2 doses today Drink lots of water Get plenty of rest Consider Flonase for your sinus symptoms Call for problems   ED Prescriptions     Medication Sig Dispense Auth. Provider   nirmatrelvir/ritonavir EUA (PAXLOVID) 20 x 150 MG & 10 x 100MG  TABS Take 3 tablets by mouth 2 (two) times daily for 5 days. Patient GFR is over 60.  Take nirmatrelvir (150 mg) two tablets twice daily for 5 days and ritonavir (100 mg) one tablet twice daily for 5 days. 30 tablet , MD      PDMP not reviewed this encounter.   Eustace Moore, MD  12/04/21 1001  

## 2021-12-04 NOTE — Discharge Instructions (Signed)
I am prescribing paxlovid.  Take 1 dose twice a day for 5 days.  Take 2 doses today Drink lots of water Get plenty of rest Consider Flonase for your sinus symptoms Call for problems

## 2022-05-04 ENCOUNTER — Telehealth: Payer: Self-pay | Admitting: Physician Assistant

## 2022-05-04 DIAGNOSIS — Z131 Encounter for screening for diabetes mellitus: Secondary | ICD-10-CM

## 2022-05-04 DIAGNOSIS — E7800 Pure hypercholesterolemia, unspecified: Secondary | ICD-10-CM

## 2022-05-04 DIAGNOSIS — R790 Abnormal level of blood mineral: Secondary | ICD-10-CM

## 2022-05-04 DIAGNOSIS — R7989 Other specified abnormal findings of blood chemistry: Secondary | ICD-10-CM

## 2022-05-04 DIAGNOSIS — Z0000 Encounter for general adult medical examination without abnormal findings: Secondary | ICD-10-CM

## 2022-05-04 DIAGNOSIS — E559 Vitamin D deficiency, unspecified: Secondary | ICD-10-CM

## 2022-05-04 NOTE — Telephone Encounter (Signed)
Ok to order lipid, cmp, tsh, cbc if she is wanting anything specific let us know

## 2022-05-04 NOTE — Telephone Encounter (Signed)
Patient is scheduled for an annual physical on 6/16 & would like to be able to come in early for lab work. ?

## 2022-05-04 NOTE — Telephone Encounter (Signed)
Labs ordered. LVM letting patient know. Added vitamin d due to dx: vitamin d deficiency.  ?

## 2022-06-10 ENCOUNTER — Ambulatory Visit (INDEPENDENT_AMBULATORY_CARE_PROVIDER_SITE_OTHER): Payer: 59 | Admitting: Physician Assistant

## 2022-06-10 ENCOUNTER — Encounter: Payer: Self-pay | Admitting: Physician Assistant

## 2022-06-10 VITALS — BP 113/62 | HR 59 | Ht 60.0 in | Wt 140.0 lb

## 2022-06-10 DIAGNOSIS — Z0000 Encounter for general adult medical examination without abnormal findings: Secondary | ICD-10-CM

## 2022-06-10 DIAGNOSIS — E663 Overweight: Secondary | ICD-10-CM | POA: Diagnosis not present

## 2022-06-10 MED ORDER — PHENTERMINE HCL 37.5 MG PO TABS: ORAL_TABLET | ORAL | 0 refills | Status: DC

## 2022-06-10 NOTE — Patient Instructions (Signed)

## 2022-06-10 NOTE — Progress Notes (Signed)
Subjective:     Olivia Gardner is a 31 y.o. female and is here for a comprehensive physical exam. The patient reports problems - she is struggling with weight loss. She has tried intermittent fasting, calorie counting, exercise 2-3 times a week but not making a difference in weight .  Social History   Socioeconomic History   Marital status: Married    Spouse name: Not on file   Number of children: Not on file   Years of education: Not on file   Highest education level: Not on file  Occupational History   Not on file  Tobacco Use   Smoking status: Never   Smokeless tobacco: Never  Vaping Use   Vaping Use: Never used  Substance and Sexual Activity   Alcohol use: Not Currently    Alcohol/week: 0.0 standard drinks of alcohol   Drug use: No   Sexual activity: Yes    Birth control/protection: I.U.D.  Other Topics Concern   Not on file  Social History Narrative   Not on file   Social Determinants of Health   Financial Resource Strain: Low Risk  (03/31/2019)   Overall Financial Resource Strain (CARDIA)    Difficulty of Paying Living Expenses: Not hard at all  Food Insecurity: Not on file  Transportation Needs: Not on file  Physical Activity: Not on file  Stress: Not on file  Social Connections: Not on file  Intimate Partner Violence: Not on file   Health Maintenance  Topic Date Due   COVID-19 Vaccine (4 - Pfizer series) 01/31/2021   INFLUENZA VACCINE  07/26/2022   PAP SMEAR-Modifier  06/10/2023   TETANUS/TDAP  06/17/2031   Hepatitis C Screening  Completed   HIV Screening  Completed   HPV VACCINES  Aged Out    The following portions of the patient's history were reviewed and updated as appropriate: allergies, current medications, past family history, past medical history, past social history, past surgical history, and problem list.  Review of Systems A comprehensive review of systems was negative.   Objective:    BP 113/62   Pulse (!) 59   Ht 5' (1.524 m)   Wt  140 lb (63.5 kg)   SpO2 99%   BMI 27.34 kg/m  General appearance: alert, cooperative, and appears stated age Head: Normocephalic, without obvious abnormality, atraumatic Eyes: conjunctivae/corneas clear. PERRL, EOM's intact. Fundi benign. Ears: normal TM's and external ear canals both ears Nose: Nares normal. Septum midline. Mucosa normal. No drainage or sinus tenderness. Throat: lips, mucosa, and tongue normal; teeth and gums normal Neck: no adenopathy, no carotid bruit, no JVD, supple, symmetrical, trachea midline, and thyroid not enlarged, symmetric, no tenderness/mass/nodules Back: symmetric, no curvature. ROM normal. No CVA tenderness. Lungs: clear to auscultation bilaterally Heart: regular rate and rhythm, S1, S2 normal, no murmur, click, rub or gallop Abdomen: soft, non-tender; bowel sounds normal; no masses,  no organomegaly Extremities: extremities normal, atraumatic, no cyanosis or edema Pulses: 2+ and symmetric Skin: Skin color, texture, turgor normal. No rashes or lesions Lymph nodes: Cervical, supraclavicular, and axillary nodes normal. Neurologic: Alert and oriented X 3, normal strength and tone. Normal symmetric reflexes. Normal coordination and gait    .Marland Kitchen    06/10/2022   10:46 AM 07/31/2020    9:18 AM 06/25/2019    9:08 AM  Depression screen PHQ 2/9  Decreased Interest 1 1 0  Down, Depressed, Hopeless 1 1 1   PHQ - 2 Score 2 2 1   Altered sleeping 1  1  Tired, decreased energy 1  1  Change in appetite   0  Feeling bad or failure about yourself  1  0  Trouble concentrating 0  0  Moving slowly or fidgety/restless 0  0  Suicidal thoughts 0  0  PHQ-9 Score 5  3  Difficult doing work/chores Somewhat difficult  Somewhat difficult   .Marland Kitchen    06/10/2022   10:46 AM 06/25/2019    9:08 AM  GAD 7 : Generalized Anxiety Score  Nervous, Anxious, on Edge 1 0  Control/stop worrying 1 0  Worry too much - different things 1 1  Trouble relaxing 1 0  Restless 0 0  Easily annoyed  or irritable 1 0  Afraid - awful might happen 0 0  Total GAD 7 Score 5 1  Anxiety Difficulty Somewhat difficult Not difficult at all     Assessment:    Healthy female exam.     Plan:    Marland KitchenMarland KitchenDiagnoses and all orders for this visit:  Routine physical examination  Overweight (BMI 25.0-29.9) -     phentermine (ADIPEX-P) 37.5 MG tablet; One tab by mouth qAM   Discussed 150 minutes of exercise a week.  Encouraged vitamin D 1000 units and Calcium 1300mg  or 4 servings of dairy a day.  Fasting labs ordered today GAD/PHQ up some but has 2 young kids at home does not want intervention . Discussed low carb diet with 1500 calories and 80g of protein.  Exercising at least 150 minutes a week.  My Fitness Pal could be a Marland Kitchen.  Start phentermine Discussed side effect Follow up in 1 month for BP recheck Pap UTD    See After Visit Summary for Counseling Recommendations

## 2022-06-11 LAB — COMPLETE METABOLIC PANEL WITH GFR
AG Ratio: 1.9 (calc) (ref 1.0–2.5)
ALT: 8 U/L (ref 6–29)
AST: 14 U/L (ref 10–30)
Albumin: 4.8 g/dL (ref 3.6–5.1)
Alkaline phosphatase (APISO): 59 U/L (ref 31–125)
BUN: 13 mg/dL (ref 7–25)
CO2: 25 mmol/L (ref 20–32)
Calcium: 9.4 mg/dL (ref 8.6–10.2)
Chloride: 104 mmol/L (ref 98–110)
Creat: 0.8 mg/dL (ref 0.50–0.97)
Globulin: 2.5 g/dL (calc) (ref 1.9–3.7)
Glucose, Bld: 82 mg/dL (ref 65–99)
Potassium: 4.6 mmol/L (ref 3.5–5.3)
Sodium: 139 mmol/L (ref 135–146)
Total Bilirubin: 1.1 mg/dL (ref 0.2–1.2)
Total Protein: 7.3 g/dL (ref 6.1–8.1)
eGFR: 100 mL/min/{1.73_m2} (ref >=60)

## 2022-06-11 LAB — CBC WITH DIFFERENTIAL/PLATELET
Absolute Monocytes: 568 cells/uL (ref 200–950)
Basophils Absolute: 28 cells/uL (ref 0–200)
Basophils Relative: 0.4 %
Eosinophils Absolute: 149 cells/uL (ref 15–500)
Eosinophils Relative: 2.1 %
HCT: 42.6 % (ref 35.0–45.0)
Hemoglobin: 14 g/dL (ref 11.7–15.5)
Lymphs Abs: 2315 cells/uL (ref 850–3900)
MCH: 28.6 pg (ref 27.0–33.0)
MCHC: 32.9 g/dL (ref 32.0–36.0)
MCV: 87.1 fL (ref 80.0–100.0)
MPV: 11.4 fL (ref 7.5–12.5)
Monocytes Relative: 8 %
Neutro Abs: 4040 cells/uL (ref 1500–7800)
Neutrophils Relative %: 56.9 %
Platelets: 225 10*3/uL (ref 140–400)
RBC: 4.89 10*6/uL (ref 3.80–5.10)
RDW: 16.5 % — ABNORMAL HIGH (ref 11.0–15.0)
Total Lymphocyte: 32.6 %
WBC: 7.1 10*3/uL (ref 3.8–10.8)

## 2022-06-11 LAB — LIPID PANEL W/REFLEX DIRECT LDL
Cholesterol: 280 mg/dL — ABNORMAL HIGH (ref <200)
HDL: 58 mg/dL (ref >=50)
LDL Cholesterol (Calc): 186 mg/dL (calc) — ABNORMAL HIGH
Non-HDL Cholesterol (Calc): 222 mg/dL (calc) — ABNORMAL HIGH (ref <130)
Total CHOL/HDL Ratio: 4.8 (calc) (ref <5.0)
Triglycerides: 186 mg/dL — ABNORMAL HIGH (ref <150)

## 2022-06-11 LAB — TSH: TSH: 4.15 mIU/L

## 2022-06-11 LAB — VITAMIN D 25 HYDROXY (VIT D DEFICIENCY, FRACTURES): Vit D, 25-Hydroxy: 23 ng/mL — ABNORMAL LOW (ref 30–100)

## 2022-06-13 ENCOUNTER — Encounter: Payer: Self-pay | Admitting: Physician Assistant

## 2022-06-13 NOTE — Progress Notes (Signed)
Olivia Gardner,   Kidney, liver, glucose look great.   Thyroid is in normal range but up quite a bit from one year ago. We could supplement with thyroid medication to get you to optimal range since you are having some low thyroid symptoms? Thoughts?   Vitamin D is very low. Start 2000 units of D3 daily.   Hemoglobin looks good.   Cholesterol is elevated. Weight loss, avoid fried and fatty foods and limit carbs/sugars. Recheck in 6 months.

## 2022-06-14 ENCOUNTER — Telehealth: Payer: Self-pay | Admitting: Physician Assistant

## 2022-06-14 MED ORDER — LEVOTHYROXINE SODIUM 25 MCG PO TABS: 25.0000 ug | ORAL_TABLET | Freq: Every day | ORAL | 1 refills | Status: DC

## 2022-06-14 NOTE — Telephone Encounter (Signed)
Pt called in just before lunch to express some concerns that she with lab results from her physical. She stated that she sent a message via mychart. She has questions about her medication as well.

## 2022-06-14 NOTE — Telephone Encounter (Signed)
Responded via mychart

## 2022-06-19 ENCOUNTER — Emergency Department
Admission: EM | Admit: 2022-06-19 | Discharge: 2022-06-19 | Disposition: A | Discharge status: Home / Self Care | Payer: 59 | Source: Home / Self Care | Attending: Family Medicine | Admitting: Family Medicine

## 2022-06-19 ENCOUNTER — Encounter: Payer: Self-pay | Admitting: Emergency Medicine

## 2022-06-19 DIAGNOSIS — N3091 Cystitis, unspecified with hematuria: Secondary | ICD-10-CM

## 2022-06-19 DIAGNOSIS — R300 Dysuria: Secondary | ICD-10-CM

## 2022-06-19 LAB — POCT URINALYSIS DIP (MANUAL ENTRY)
Bilirubin, UA: NEGATIVE
Glucose, UA: NEGATIVE mg/dL
Ketones, POC UA: NEGATIVE mg/dL
Nitrite, UA: NEGATIVE
Protein Ur, POC: NEGATIVE mg/dL
Spec Grav, UA: 1.025 (ref 1.010–1.025)
Urobilinogen, UA: 0.2 E.U./dL
pH, UA: 6.5 (ref 5.0–8.0)

## 2022-06-19 MED ORDER — NITROFURANTOIN MONOHYD MACRO 100 MG PO CAPS: 100.0000 mg | ORAL_CAPSULE | Freq: Two times a day (BID) | ORAL | 0 refills | Status: DC

## 2022-06-19 MED ORDER — PHENAZOPYRIDINE HCL 200 MG PO TABS: 200.0000 mg | ORAL_TABLET | Freq: Three times a day (TID) | ORAL | 0 refills | Status: DC

## 2022-06-21 ENCOUNTER — Ambulatory Visit (INDEPENDENT_AMBULATORY_CARE_PROVIDER_SITE_OTHER): Payer: 59 | Admitting: Physician Assistant

## 2022-06-21 ENCOUNTER — Encounter: Payer: Self-pay | Admitting: Physician Assistant

## 2022-06-21 VITALS — BP 114/71 | HR 83 | Ht 60.0 in | Wt 132.0 lb

## 2022-06-21 DIAGNOSIS — R300 Dysuria: Secondary | ICD-10-CM | POA: Diagnosis not present

## 2022-06-21 DIAGNOSIS — N898 Other specified noninflammatory disorders of vagina: Secondary | ICD-10-CM | POA: Diagnosis not present

## 2022-06-21 DIAGNOSIS — N3090 Cystitis, unspecified without hematuria: Secondary | ICD-10-CM

## 2022-06-21 DIAGNOSIS — N766 Ulceration of vulva: Secondary | ICD-10-CM | POA: Diagnosis not present

## 2022-06-21 LAB — URINE CULTURE
MICRO NUMBER:: 13570593
SPECIMEN QUALITY:: ADEQUATE

## 2022-06-21 LAB — POCT URINALYSIS DIP (CLINITEK)
Bilirubin, UA: NEGATIVE
Blood, UA: NEGATIVE
Glucose, UA: NEGATIVE mg/dL
Ketones, POC UA: NEGATIVE mg/dL
Leukocytes, UA: NEGATIVE
Nitrite, UA: NEGATIVE
POC PROTEIN,UA: NEGATIVE
Spec Grav, UA: <=1.005 — AB (ref 1.010–1.025)
Urobilinogen, UA: 0.2 E.U./dL
pH, UA: 6.5 (ref 5.0–8.0)

## 2022-06-21 MED ORDER — TRIAMCINOLONE ACETONIDE 0.5 % EX OINT: 1.0000 | TOPICAL_OINTMENT | Freq: Two times a day (BID) | CUTANEOUS | 0 refills | Status: DC

## 2022-06-21 MED ORDER — VALACYCLOVIR HCL 1 G PO TABS: 1000.0000 mg | ORAL_TABLET | Freq: Two times a day (BID) | ORAL | 0 refills | Status: DC

## 2022-06-23 LAB — URINE CULTURE
MICRO NUMBER:: 13583038
Result:: NO GROWTH
SPECIMEN QUALITY:: ADEQUATE

## 2022-06-23 NOTE — Progress Notes (Signed)
Sure swab still pending but no bacteria growth on urine culture.

## 2022-06-24 LAB — SURESWAB® ADVANCED VAGINITIS PLUS,TMA
C. trachomatis RNA, TMA: NOT DETECTED
CANDIDA SPECIES: NOT DETECTED
Candida glabrata: NOT DETECTED
N. gonorrhoeae RNA, TMA: NOT DETECTED
SURESWAB(R) ADV BACTERIAL VAGINOSIS(BV),TMA: POSITIVE — AB
TRICHOMONAS VAGINALIS (TV),TMA: NOT DETECTED

## 2022-06-24 LAB — SURESWAB HSV, TYPE 1/2 DNA, PCR
HSV 1 DNA: NOT DETECTED
HSV 2 DNA: NOT DETECTED

## 2022-06-27 ENCOUNTER — Other Ambulatory Visit: Payer: Self-pay | Admitting: Family Medicine

## 2022-06-27 MED ORDER — METRONIDAZOLE 500 MG PO TABS: 500.0000 mg | ORAL_TABLET | Freq: Two times a day (BID) | ORAL | 0 refills | Status: DC

## 2022-06-27 NOTE — Progress Notes (Signed)
Hi Olivia Gardner, I am covering for [facility] while she is out.  Your vaginal swab was positive for overgrowth of bacteria also known as bacterial vaginitis.  I am going to send over prescription for metronidazole to help clear this up.  Negative for any yeast or trichomonas, gonorrhea, or chlamydia.

## 2022-07-19 ENCOUNTER — Encounter: Payer: Self-pay | Admitting: Physician Assistant

## 2022-08-07 ENCOUNTER — Other Ambulatory Visit: Payer: Self-pay | Admitting: Physician Assistant

## 2022-08-07 DIAGNOSIS — E663 Overweight: Secondary | ICD-10-CM

## 2022-08-08 ENCOUNTER — Encounter: Payer: Self-pay | Admitting: Physician Assistant

## 2022-08-08 DIAGNOSIS — E663 Overweight: Secondary | ICD-10-CM

## 2022-08-08 MED ORDER — PHENTERMINE HCL 37.5 MG PO TABS: ORAL_TABLET | ORAL | 0 refills | Status: DC

## 2022-08-10 ENCOUNTER — Other Ambulatory Visit: Payer: Self-pay | Admitting: Physician Assistant

## 2022-10-11 ENCOUNTER — Ambulatory Visit (INDEPENDENT_AMBULATORY_CARE_PROVIDER_SITE_OTHER): Payer: 59

## 2022-10-11 VITALS — BP 122/78 | HR 91 | Ht 60.0 in | Wt 129.0 lb

## 2022-10-11 DIAGNOSIS — Z0000 Encounter for general adult medical examination without abnormal findings: Secondary | ICD-10-CM

## 2022-10-11 NOTE — Progress Notes (Signed)
   Subjective:     Olivia Gardner is a 31 y.o. female here at [facility] for a routine exam.  Current complaints: none.  Personal health questionnaire reviewed: yes.  Do you have a primary care provider? Yes, last seen 06/10/2022   [place] Office Visit from 06/10/2022 in [place]  PHQ-2 Total Score 2       Health Maintenance Due  Topic Date Due   COVID-19 Vaccine (4 - Pfizer series) 01/31/2021   INFLUENZA VACCINE  07/26/2022    Risk factors for chronic health problems: Smoking: Denies Alchohol/how much: Denies Illicit drug use: Denies Exercise: when she is able to Pt BMI: Body mass index is 25.19 kg/m.   Gynecologic History No LMP recorded. (Menstrual status: IUD). Contraception: IUD Sexual health: 1 female partner, no issues Last Pap: 05/2020. Results were: normal Last mammogram: n/a  Obstetric History OB History  Gravida Para Term Preterm AB Living  2 2 2     2   SAB IAB Ectopic Multiple Live Births        0 2    # Outcome Date GA Lbr Len/2nd Weight Sex Delivery Anes PTL Lv  2 Term 08/15/21 [redacted]w[redacted]d 04:05 / 00:17 4 lb 15 oz (2.24 kg) F Vag-Spont EPI  LIV  1 Term 04/01/19 102w0d 17:44 / 02:48 7 lb 6.9 oz (3.371 kg) M Vag-Spont EPI, Local  LIV     Birth Comments: skin tear scalp on right side of head     The following portions of the patient's history were reviewed and updated as appropriate: allergies, current medications, past family history, past medical history, past social history, past surgical history, and problem list.  Review of Systems Pertinent items are noted in HPI.    Objective:   BP 122/78   Pulse 91   Ht 5' (1.524 m)   Wt 129 lb (58.5 kg)   BMI 25.19 kg/m  VS reviewed, nursing note reviewed,  Constitutional: well developed, well nourished, no distress HEENT: normocephalic CV: normal rate, regular rhythm Pulm/chest wall: normal effort, lung sounds clear bilaterally Breast Exam: right breast normal  without mass, skin or nipple changes or axillary nodes, left breast normal without mass, skin or nipple changes or axillary nodes Abdomen: soft Neuro: alert and oriented x 3 Skin: warm, dry Psych: affect normal Pelvic exam: Deferred  Assessment/Plan:   1. Well woman exam (no gynecological exam) - Pap smear up to date - Declines STI screening - Has IUD in place, no issues   Return in about 1 year (around 10/12/2023).   Renee Harder, CNM 1:42 PM

## 2022-10-11 NOTE — Progress Notes (Signed)
Last pap 06/09/20- negative

## 2022-10-18 ENCOUNTER — Encounter: Payer: Self-pay | Admitting: Obstetrics and Gynecology

## 2022-10-18 ENCOUNTER — Other Ambulatory Visit (HOSPITAL_COMMUNITY)
Admission: RE | Admit: 2022-10-18 | Discharge: 2022-10-18 | Disposition: A | Discharge status: Home / Self Care | Payer: 59 | Source: Ambulatory Visit | Attending: Obstetrics and Gynecology | Admitting: Obstetrics and Gynecology

## 2022-10-18 ENCOUNTER — Ambulatory Visit (INDEPENDENT_AMBULATORY_CARE_PROVIDER_SITE_OTHER): Payer: 59 | Admitting: Obstetrics and Gynecology

## 2022-10-18 VITALS — BP 140/90 | HR 114 | Resp 16 | Ht 60.0 in | Wt 129.0 lb

## 2022-10-18 DIAGNOSIS — N898 Other specified noninflammatory disorders of vagina: Secondary | ICD-10-CM | POA: Diagnosis present

## 2022-10-18 MED ORDER — VALACYCLOVIR HCL 1 G PO TABS: 1000.0000 mg | ORAL_TABLET | Freq: Two times a day (BID) | ORAL | 0 refills | Status: AC

## 2022-10-18 MED ORDER — FLUCONAZOLE 150 MG PO TABS: 150.0000 mg | ORAL_TABLET | Freq: Every day | ORAL | 0 refills | Status: DC

## 2022-10-18 MED ORDER — LIDOCAINE 0.5 % EX GEL: 1.0000 [in_us] | Freq: Three times a day (TID) | CUTANEOUS | 0 refills | Status: DC | PRN

## 2022-10-18 NOTE — Progress Notes (Signed)
GYNECOLOGY ENCOUNTER NOTE  History:     Olivia Gardner is a 31 y.o. G15P2002 female here for a routine annual gynecologic exam.  Current complaints: vaginal pain, and irritation.   Her right side of her labia both internal and external. Reports this is the 2nd time this has happened after intercourse with her husband. No history of HSV. She was seen by her PCP in June after the first time this happened. She had intercourse with her husband, they did use lubrication. She reports pain with urination immediatly after. Work up in June was negative for HSV. She reports labial ulcer in June and now. No bleeding. No dysuria. The pain occurs when the urine touches the ulcer.    Gynecologic History No LMP recorded. (Menstrual status: IUD).   Obstetric History OB History  Gravida Para Term Preterm AB Living  2 2 2     2   SAB IAB Ectopic Multiple Live Births        0 2    # Outcome Date GA Lbr Len/2nd Weight Sex Delivery Anes PTL Lv  2 Term 08/15/21 [redacted]w[redacted]d 04:05 / 00:17 4 lb 15 oz (2.24 kg) F Vag-Spont EPI  LIV  1 Term 04/01/19 [redacted]w[redacted]d 17:44 / 02:48 7 lb 6.9 oz (3.371 kg) M Vag-Spont EPI, Local  LIV     Birth Comments: skin tear scalp on right side of head    Past Medical History:  Diagnosis Date   Abnormal weight gain 06/25/2019   COVID-19 12/04/2021   Hyperlipidemia    Vitamin D deficiency     Past Surgical History:  Procedure Laterality Date   WISDOM TOOTH EXTRACTION      Current Outpatient Medications on File Prior to Visit  Medication Sig Dispense Refill   Levonorgestrel (LILETTA, 52 MG, IU) by Intrauterine route.     phentermine (ADIPEX-P) 37.5 MG tablet One tab by mouth qAM 30 tablet 0   No current facility-administered medications on file prior to visit.    Allergies  Allergen Reactions   Sulfacetamide Sodium Other (See Comments)    Per Patient caused damage to her eye to the point she has to get new prescription lenses.  Her eye doctor wanted it noted she can not  take these any more.    Social History:  reports that she has never smoked. She has never used smokeless tobacco. She reports that she does not currently use alcohol. She reports that she does not use drugs.  Family History  Problem Relation Age of Onset   Hyperlipidemia Mother    Hypertension Father    Diabetes Maternal Grandmother    Diabetes Paternal Grandmother    Asthma Neg Hx    Stroke Neg Hx     The following portions of the patient's history were reviewed and updated as appropriate: allergies, current medications, past family history, past medical history, past social history, past surgical history and problem list.  Review of Systems Pertinent items noted in HPI and remainder of comprehensive ROS otherwise negative.  Physical Exam:  BP (!) 140/90   Pulse (!) 114   Resp 16   Ht 5' (1.524 m)   Wt 129 lb (58.5 kg)   Breastfeeding No   BMI 25.19 kg/m  CONSTITUTIONAL: Well-developed, well-nourished female in no acute distress.  HENT:  Normocephalic, atraumatic, External right and left ear normal.  EYES: Conjunctivae and EOM are normal. Pupils are equal, round, and reactive to light. No scleral icterus.  NECK: Normal range of motion,  supple, no masses.  Normal thyroid.  SKIN: Skin is warm and dry. No rash noted. Not diaphoretic. No erythema. No pallor. MUSCULOSKELETAL: Normal range of motion. No tenderness.  No cyanosis, clubbing, or edema. NEUROLOGIC: Alert and oriented to person, place, and time. Normal reflexes, muscle tone coordination.  PSYCHIATRIC: Normal mood and affect. Normal behavior. Normal judgment and thought content. CARDIOVASCULAR: Normal heart rate noted, regular rhythm RESPIRATORY: Clear to auscultation bilaterally. Effort and breath sounds normal, no problems with respiration noted. BREASTS: Symmetric in size. No masses, tenderness, skin changes, nipple drainage, or lymphadenopathy bilaterally. Performed in the presence of a chaperone. ABDOMEN: Soft, no  distention noted.  No tenderness, rebound or guarding.  PELVIC: Bilateral ulceration noted on the inner aspect of both the left and right labia minora. No purulence or odor. HSV swab and culture collected.    Assessment and Plan:   1. Vaginal irritation  - Cervicovaginal ancillary only( Agency) - Urine Culture - Herpes simplex virus culture - Culture, fungus without smear  - Called patient with results of swabs: negative HSV, stop valtex. Continue diflucan. Rx for flagyl sent.  If this happens again would recommend biopsy.  - Once healed, apply coconut oil for skin healing.  - Lubrication always.    Karyn Brull, Artist Pais, [place] for [employer], [place]

## 2022-10-19 LAB — CERVICOVAGINAL ANCILLARY ONLY
Bacterial Vaginitis (gardnerella): POSITIVE — AB
Candida Glabrata: NEGATIVE
Candida Vaginitis: NEGATIVE
Comment: NEGATIVE
Comment: NEGATIVE
Comment: NEGATIVE

## 2022-10-20 ENCOUNTER — Other Ambulatory Visit: Payer: Self-pay | Admitting: Obstetrics and Gynecology

## 2022-10-20 LAB — URINE CULTURE
MICRO NUMBER:: 14098090
SPECIMEN QUALITY:: ADEQUATE

## 2022-10-20 LAB — HERPES SIMPLEX VIRUS CULTURE
MICRO NUMBER:: 14096094
SPECIMEN QUALITY:: ADEQUATE

## 2022-10-20 MED ORDER — METRONIDAZOLE 500 MG PO TABS: 500.0000 mg | ORAL_TABLET | Freq: Two times a day (BID) | ORAL | 0 refills | Status: AC

## 2022-11-17 LAB — CULTURE, FUNGUS WITHOUT SMEAR
CULTURE:: NO GROWTH
MICRO NUMBER:: 14098083
SPECIMEN QUALITY:: ADEQUATE

## 2022-12-06 ENCOUNTER — Ambulatory Visit (INDEPENDENT_AMBULATORY_CARE_PROVIDER_SITE_OTHER): Payer: 59 | Admitting: Obstetrics and Gynecology

## 2022-12-06 ENCOUNTER — Encounter: Payer: Self-pay | Admitting: Obstetrics and Gynecology

## 2022-12-06 VITALS — BP 103/71 | HR 71 | Ht 60.0 in | Wt 127.0 lb

## 2022-12-06 DIAGNOSIS — N898 Other specified noninflammatory disorders of vagina: Secondary | ICD-10-CM | POA: Diagnosis not present

## 2022-12-07 NOTE — Progress Notes (Signed)
GYNECOLOGY ENCOUNTER NOTE  History:     Olivia Gardner is a 31 y.o. G14P2002 female here for follow up. She was seen on 10/24 for a reaction in her vaginal area following intercourse. This was the 2nd time this occurred following intercourse. All of her vaginal testing came back negative; she was treated for BV and complete antibiotics. She had negative HSV. She reports much improved symptoms. She does admit to have fear to have intercourse again.  She has been applying coconut oil to the vagina regularly to help heal the vaginal tissue. Her younger child is 17 months old.      Obstetric History OB History  Gravida Para Term Preterm AB Living  2 2 2     2   SAB IAB Ectopic Multiple Live Births        0 2    # Outcome Date GA Lbr Len/2nd Weight Sex Delivery Anes PTL Lv  2 Term 08/15/21 [redacted]w[redacted]d 04:05 / 00:17 4 lb 15 oz (2.24 kg) F Vag-Spont EPI  LIV  1 Term 04/01/19 [redacted]w[redacted]d 17:44 / 02:48 7 lb 6.9 oz (3.371 kg) M Vag-Spont EPI, Local  LIV     Birth Comments: skin tear scalp on right side of head    Past Medical History:  Diagnosis Date   Abnormal weight gain 06/25/2019   COVID-19 12/04/2021   Hyperlipidemia    Vitamin D deficiency     Past Surgical History:  Procedure Laterality Date   WISDOM TOOTH EXTRACTION      Current Outpatient Medications on File Prior to Visit  Medication Sig Dispense Refill   cholecalciferol (VITAMIN D3) 25 MCG (1000 UNIT) tablet Take 1,000 Units by mouth daily.     Levonorgestrel (LILETTA, 52 MG, IU) by Intrauterine route.     fluconazole (DIFLUCAN) 150 MG tablet Take 1 tablet (150 mg total) by mouth daily. Take one dose today and repeat in 3 days. (Patient not taking: Reported on 12/06/2022) 2 tablet 0   Lidocaine 0.5 % GEL Apply 1 inch topically 3 (three) times daily as needed. (Patient not taking: Reported on 12/06/2022) 170 g 0   phentermine (ADIPEX-P) 37.5 MG tablet One tab by mouth qAM (Patient not taking: Reported on 12/06/2022) 30 tablet 0    No current facility-administered medications on file prior to visit.    Allergies  Allergen Reactions   Sulfacetamide Sodium Other (See Comments)    Per Patient caused damage to her eye to the point she has to get new prescription lenses.  Her eye doctor wanted it noted she can not take these any more.    Social History:  reports that she has never smoked. She has never used smokeless tobacco. She reports that she does not currently use alcohol. She reports that she does not use drugs.  Family History  Problem Relation Age of Onset   Hyperlipidemia Mother    Hypertension Father    Diabetes Maternal Grandmother    Diabetes Paternal Grandmother    Asthma Neg Hx    Stroke Neg Hx     The following portions of the patient's history were reviewed and updated as appropriate: allergies, current medications, past family history, past medical history, past social history, past surgical history and problem list.  Review of Systems Pertinent items noted in HPI and remainder of comprehensive ROS otherwise negative.  Physical Exam:  BP 103/71   Pulse 71   Ht 5' (1.524 m)   Wt 127 lb (57.6 kg)  BMI 24.80 kg/m  CONSTITUTIONAL: Well-developed, well-nourished female in no acute distress.  HENT:  Normocephalic, atraumatic EYES: Conjunctivae and EOM are normal. NECK: Normal range of motion, supple, no masses.   NEUROLOGIC: Alert and oriented to person, place, and time.  PSYCHIATRIC: Normal mood and affect.  PELVIC: Normal appearing external genitalia and urethral meatus; normal appearing vaginal mucosa and cervix.  No abnormal vaginal discharge noted.  Performed in the presence of a chaperone.   Assessment and Plan:   1. Vaginal irritation  Doing much better Water only to the vagina Continue coconut oil Start a daily probiotic.     Viktoria Gruetzmacher, Harolyn Rutherford, NP [facility] [employer], [facility]

## 2022-12-12 ENCOUNTER — Ambulatory Visit: Payer: 59 | Admitting: Physician Assistant

## 2022-12-12 ENCOUNTER — Encounter: Payer: Self-pay | Admitting: Physician Assistant

## 2022-12-12 VITALS — BP 111/57 | HR 71 | Ht 60.0 in | Wt 127.1 lb

## 2022-12-12 DIAGNOSIS — R632 Polyphagia: Secondary | ICD-10-CM

## 2022-12-12 DIAGNOSIS — R4589 Other symptoms and signs involving emotional state: Secondary | ICD-10-CM | POA: Diagnosis not present

## 2022-12-12 DIAGNOSIS — R41840 Attention and concentration deficit: Secondary | ICD-10-CM | POA: Insufficient documentation

## 2022-12-12 DIAGNOSIS — F419 Anxiety disorder, unspecified: Secondary | ICD-10-CM | POA: Insufficient documentation

## 2022-12-12 DIAGNOSIS — G479 Sleep disorder, unspecified: Secondary | ICD-10-CM

## 2022-12-12 MED ORDER — BUPROPION HCL ER (SR) 100 MG PO TB12: 100.0000 mg | ORAL_TABLET | Freq: Two times a day (BID) | ORAL | 1 refills | Status: DC

## 2022-12-12 NOTE — Progress Notes (Signed)
Established Patient Office Visit  Subjective   Patient ID: Olivia Gardner, female    DOB: Apr 22, 1991  Age: 31 y.o. MRN: [id number]  Chief Complaint  Patient presents with   Follow-up    Weight management    HPI Pt is a 31 yo female who presents to the clinic to follow-up on phentermine.  She was started on phentermine for weight loss and binge eating back in August 2023.  She did not tolerate the full dose of 37.5 mg very well.  She is actually been taking the half tablets and doing very well.  She really notices when she is on it that it stops her "hangry" feeling and also her desire to binge eat.  She is also noticed it has helped her with focus and staying on top of things.  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  She has been out of it for the last 2 weeks and noticed a huge deficit.  She has also noticed more stress/binge eating.  She has a counselor that she meets with regularly and overall she has had a hard year.  Her and her husband are financially struggling and she feels very overwhelmed and overextended.  She is having trouble sleeping and just "keeping up with life". She does think focus is a big issue with this.  She does have a behavioral health assessment due to screening test for ADD that were positive.  Her counselor thinks she needs to start something before then. No SI/HC.  Marland Kitchen. Active Ambulatory Problems    Diagnosis Date Noted   Elevated LDL cholesterol level 01/30/2015   Tinnitus 01/30/2015   Epicondylitis elbow, medial 02/02/2015   Hyperlipidemia 03/25/2016   Ganglion of right wrist 03/25/2016   Vitamin D deficiency 03/28/2016   Low iron stores 03/28/2016   Post partum thyroiditis 06/26/2019   Other dietary vitamin B12 deficiency anemia 09/24/2019   Pica in adults 07/31/2020   Elevated TSH 07/31/2020   IUD (intrauterine device) in place 09/07/2021   Overweight (BMI 25.0-29.9) 06/10/2022   Genital labial  ulcer 06/21/2022   Depressed mood 12/12/2022   Anxiety 12/12/2022   Poor concentration 12/12/2022   Binge eating 12/12/2022   Trouble in sleeping 12/12/2022   Resolved Ambulatory Problems    Diagnosis Date Noted   Tendonitis 01/30/2015   Shortness of breath 01/30/2015   Heart burn 01/30/2015   Spotting 03/30/2015   No energy 03/25/2016   Supervision of low-risk first pregnancy 08/28/2018   Rubella non-immune status, antepartum 09/01/2018   Post-dates pregnancy 03/31/2019   Hair loss 06/25/2019   Abnormal weight gain 06/25/2019   Supervision of other normal pregnancy, antepartum 02/03/2021   Pelvic pain affecting pregnancy 05/17/2021   Anemia, antepartum 07/19/2021   Pregnancy affected by fetal growth restriction 07/29/2021   Poor fetal growth 08/14/2021   Encounter for IUD insertion 08/15/2021   Vaginal delivery 08/16/2021   Past Medical History:  Diagnosis Date   COVID-19 12/04/2021     ROS See HPI.    Objective:     BP (!) 111/57 (BP Location: Right Arm, Patient Position: Sitting, Cuff Size: Large)   Pulse 71   Ht 5' (1.524 m)   Wt 127 lb 1.9 oz (57.7 kg)   SpO2 98%   Breastfeeding No   BMI 24.83 kg/m  BP Readings from Last 3 Encounters:  12/12/22 (!) 111/57  12/06/22 103/71  10/18/22 (!) 140/90   Wt  Readings from Last 3 Encounters:  12/12/22 127 lb 1.9 oz (57.7 kg)  12/06/22 127 lb (57.6 kg)  10/18/22 129 lb (58.5 kg)   ..    12/12/2022    9:12 AM 06/10/2022   10:46 AM 07/31/2020    9:18 AM 06/25/2019    9:08 AM  Depression screen PHQ 2/9  Decreased Interest 3 1 1  0  Down, Depressed, Hopeless 3 1 1 1   PHQ - 2 Score 6 2 2 1   Altered sleeping 3 1  1   Tired, decreased energy 3 1  1   Change in appetite 1   0  Feeling bad or failure about yourself  3 1  0  Trouble concentrating 3 0  0  Moving slowly or fidgety/restless 1 0  0  Suicidal thoughts 0 0  0  PHQ-9 Score 20 5  3   Difficult doing work/chores Very difficult Somewhat difficult  Somewhat  difficult   ..    12/12/2022    9:13 AM 06/10/2022   10:46 AM 06/25/2019    9:08 AM  GAD 7 : Generalized Anxiety Score  Nervous, Anxious, on Edge 3 1 0  Control/stop worrying 3 1 0  Worry too much - different things 3 1 1   Trouble relaxing 3 1 0  Restless 1 0 0  Easily annoyed or irritable 3 1 0  Afraid - awful might happen 1 0 0  Total GAD 7 Score 17 5 1   Anxiety Difficulty Very difficult Somewhat difficult Not difficult at all        Physical Exam Constitutional:      Appearance: Normal appearance.  HENT:     Head: Normocephalic.  Cardiovascular:     Rate and Rhythm: Normal rate and regular rhythm.  Pulmonary:     Effort: Pulmonary effort is normal.     Breath sounds: Normal breath sounds.  Musculoskeletal:     Right lower leg: No edema.     Left lower leg: No edema.  Neurological:     General: No focal deficit present.     Mental Status: She is alert and oriented to person, place, and time.  Psychiatric:     Comments: Very tearful        Assessment & Plan:  Marland KitchenMarland KitchenBirder Robson was seen today for follow-up.  Diagnoses and all orders for this visit:  Poor concentration -     buPROPion ER (WELLBUTRIN SR) 100 MG 12 hr tablet; Take 1 tablet (100 mg total) by mouth 2 (two) times daily.  Binge eating -     buPROPion ER (WELLBUTRIN SR) 100 MG 12 hr tablet; Take 1 tablet (100 mg total) by mouth 2 (two) times daily.  Anxiety -     buPROPion ER (WELLBUTRIN SR) 100 MG 12 hr tablet; Take 1 tablet (100 mg total) by mouth 2 (two) times daily.  Depressed mood -     buPROPion ER (WELLBUTRIN SR) 100 MG 12 hr tablet; Take 1 tablet (100 mg total) by mouth 2 (two) times daily.  Trouble in sleeping   PHQ and GAD numbers not to goal and much worse than past numbers Discussed ADD/ADHD vs anxiety vs depressed mood and how they can all effect each other. She does have some extensive BH testing in January. Discussed vyvanse for binge eating and focus but decided to start wellbutrin  to see if would help with focus and mood and weight/appetite control Right now weight is normal Discussed side effects Pt aware it could make anxiety  and irritability worse and to watch out for that. For sleep consider melatonin/valarian root/calm aid for sleep.  Follow up in 4-6 weeks.  Encouraged regular exercise and counseling sessions.   Spent 44 minutes with patient reviewing chart, discussing plan and medications.    Tandy Gaw, PA-C

## 2022-12-13 ENCOUNTER — Encounter: Payer: Self-pay | Admitting: Physician Assistant

## 2023-01-03 ENCOUNTER — Other Ambulatory Visit: Payer: Self-pay | Admitting: Physician Assistant

## 2023-01-03 DIAGNOSIS — F419 Anxiety disorder, unspecified: Secondary | ICD-10-CM

## 2023-01-03 DIAGNOSIS — R41840 Attention and concentration deficit: Secondary | ICD-10-CM

## 2023-01-03 DIAGNOSIS — R632 Polyphagia: Secondary | ICD-10-CM

## 2023-01-03 DIAGNOSIS — R4589 Other symptoms and signs involving emotional state: Secondary | ICD-10-CM

## 2023-01-09 ENCOUNTER — Encounter: Payer: Self-pay | Admitting: Physician Assistant

## 2023-01-09 ENCOUNTER — Ambulatory Visit: Payer: 59 | Admitting: Physician Assistant

## 2023-01-09 DIAGNOSIS — R4589 Other symptoms and signs involving emotional state: Secondary | ICD-10-CM | POA: Diagnosis not present

## 2023-01-09 DIAGNOSIS — F419 Anxiety disorder, unspecified: Secondary | ICD-10-CM

## 2023-01-09 DIAGNOSIS — R632 Polyphagia: Secondary | ICD-10-CM | POA: Diagnosis not present

## 2023-01-09 DIAGNOSIS — R41840 Attention and concentration deficit: Secondary | ICD-10-CM | POA: Diagnosis not present

## 2023-01-09 MED ORDER — BUPROPION HCL ER (SR) 100 MG PO TB12: 100.0000 mg | ORAL_TABLET | Freq: Two times a day (BID) | ORAL | 1 refills | Status: DC

## 2023-01-09 MED ORDER — SERTRALINE HCL 25 MG PO TABS: 25.0000 mg | ORAL_TABLET | Freq: Every day | ORAL | 0 refills | Status: DC

## 2023-01-09 NOTE — Progress Notes (Signed)
Established Patient Office Visit  Subjective   Patient ID: Olivia Gardner, female    DOB: 1991-08-15  Age: 32 y.o. MRN: [id number]  Chief Complaint  Patient presents with   Follow-up    Spotting irregular period onset 1 w    HPI Pt is a 32 yo female who presents to the clinic to follow up after starting wellbutrin for depression, anxiety, poor concentration, overeating. She has done well on this and notices the energy it gives her and the motavation. She has made new goals for 2024. She feels more hopeful. She has started grad school and that feels like it will be hard on her. Her relationship with husband is doing well. No SI/HC. She continues to have some overwhelmed and gulity feelings. She sees therapist regularly. She is sleeping better. Everything is overall better but not exactly where she wants to be.    ROS   See HPI.  Objective:     BP 110/67   Pulse 83   Ht 5\' 1"  (1.549 m)   Wt 130 lb (59 kg)   SpO2 100%   BMI 24.56 kg/m  BP Readings from Last 3 Encounters:  01/09/23 110/67  12/12/22 (!) 111/57  12/06/22 103/71   Wt Readings from Last 3 Encounters:  01/09/23 130 lb (59 kg)  12/12/22 127 lb 1.9 oz (57.7 kg)  12/06/22 127 lb (57.6 kg)      ..    01/09/2023    9:54 AM 12/12/2022    9:12 AM 06/10/2022   10:46 AM 07/31/2020    9:18 AM 06/25/2019    9:08 AM  Depression screen PHQ 2/9  Decreased Interest 1 3 1 1  0  Down, Depressed, Hopeless 1 3 1 1 1   PHQ - 2 Score 2 6 2 2 1   Altered sleeping 0 3 1  1   Tired, decreased energy 0 3 1  1   Change in appetite 0 1   0  Feeling bad or failure about yourself  1 3 1   0  Trouble concentrating 0 3 0  0  Moving slowly or fidgety/restless 0 1 0  0  Suicidal thoughts 0 0 0  0  PHQ-9 Score 3 20 5  3   Difficult doing work/chores Somewhat difficult Very difficult Somewhat difficult  Somewhat difficult   .Marland Kitchen    01/09/2023    9:54 AM 12/12/2022    9:13 AM 06/10/2022   10:46 AM 06/25/2019    9:08 AM  GAD 7 :  Generalized Anxiety Score  Nervous, Anxious, on Edge 2 3 1  0  Control/stop worrying 2 3 1  0  Worry too much - different things 2 3 1 1   Trouble relaxing 1 3 1  0  Restless 0 1 0 0  Easily annoyed or irritable 1 3 1  0  Afraid - awful might happen 0 1 0 0  Total GAD 7 Score 8 17 5 1   Anxiety Difficulty Somewhat difficult Very difficult Somewhat difficult Not difficult at all     Physical Exam Constitutional:      Appearance: Normal appearance.  Cardiovascular:     Rate and Rhythm: Normal rate.     Pulses: Normal pulses.  Pulmonary:     Effort: Pulmonary effort is normal.  Neurological:     Mental Status: She is alert and oriented to person, place, and time.  Psychiatric:        Mood and Affect: Mood normal.        Assessment & Plan:  Marland KitchenMarland Kitchen  Leandria Thier was seen today for follow-up.  Diagnoses and all orders for this visit:  Anxiety -     buPROPion ER (WELLBUTRIN SR) 100 MG 12 hr tablet; Take 1 tablet (100 mg total) by mouth 2 (two) times daily. -     sertraline (ZOLOFT) 25 MG tablet; Take 1 tablet (25 mg total) by mouth daily.  Binge eating -     buPROPion ER (WELLBUTRIN SR) 100 MG 12 hr tablet; Take 1 tablet (100 mg total) by mouth 2 (two) times daily.  Poor concentration -     buPROPion ER (WELLBUTRIN SR) 100 MG 12 hr tablet; Take 1 tablet (100 mg total) by mouth 2 (two) times daily.  Depressed mood -     buPROPion ER (WELLBUTRIN SR) 100 MG 12 hr tablet; Take 1 tablet (100 mg total) by mouth 2 (two) times daily. -     sertraline (ZOLOFT) 25 MG tablet; Take 1 tablet (25 mg total) by mouth daily.   PHQ/GAD numbers improved a lot.  Pt is doing well but I do not want to increase wellbutrin with fear that it would "stimulate" her too much.  Added zoloft for her remaining mood symptoms Discussed medications and how to take Follow up in 3 months Continue with counseling regularly    Iran Planas, PA-C

## 2023-01-09 NOTE — Patient Instructions (Signed)
Start zoloft with wellbutrin.

## 2023-01-11 ENCOUNTER — Telehealth: Payer: Self-pay | Admitting: *Deleted

## 2023-01-11 NOTE — Telephone Encounter (Signed)
Returned call from 1:39 PM. Left patient a message to call to schedule F/U appointment with Noni Saupe.

## 2023-01-17 ENCOUNTER — Ambulatory Visit (INDEPENDENT_AMBULATORY_CARE_PROVIDER_SITE_OTHER): Payer: 59 | Admitting: Obstetrics and Gynecology

## 2023-01-17 ENCOUNTER — Encounter: Payer: Self-pay | Admitting: Obstetrics and Gynecology

## 2023-01-17 VITALS — BP 110/72 | HR 84 | Resp 16 | Ht 60.0 in | Wt 127.0 lb

## 2023-01-17 DIAGNOSIS — T8332XS Displacement of intrauterine contraceptive device, sequela: Secondary | ICD-10-CM | POA: Diagnosis not present

## 2023-01-17 DIAGNOSIS — Z30432 Encounter for removal of intrauterine contraceptive device: Secondary | ICD-10-CM | POA: Diagnosis not present

## 2023-01-17 MED ORDER — BORIC ACID VAGINAL 600 MG VA SUPP: 600.0000 mg | Freq: Every day | VAGINAL | 0 refills | Status: DC

## 2023-01-19 ENCOUNTER — Ambulatory Visit (INDEPENDENT_AMBULATORY_CARE_PROVIDER_SITE_OTHER): Payer: 59

## 2023-01-19 DIAGNOSIS — T8332XA Displacement of intrauterine contraceptive device, initial encounter: Secondary | ICD-10-CM | POA: Diagnosis not present

## 2023-01-19 DIAGNOSIS — T8332XS Displacement of intrauterine contraceptive device, sequela: Secondary | ICD-10-CM

## 2023-01-27 NOTE — Progress Notes (Signed)
GYNECOLOGY ENCOUNTER NOTE  History:     Olivia Gardner is a 32 y.o. G89P2002 female here for IUD removal. She has had issues with vaginal irritation for several months and has tried a lot of different treatment options. She has decided to have her IUD out as this could be contributing to the discharge and irritation.    Obstetric History OB History  Gravida Para Term Preterm AB Living  2 2 2     2   SAB IAB Ectopic Multiple Live Births        0 2    # Outcome Date GA Lbr Len/2nd Weight Sex Delivery Anes PTL Lv  2 Term 08/15/21 [redacted]w[redacted]d 04:05 / 00:17 4 lb 15 oz (2.24 kg) F Vag-Spont EPI  LIV  1 Term 04/01/19 [redacted]w[redacted]d 17:44 / 02:48 7 lb 6.9 oz (3.371 kg) M Vag-Spont EPI, Local  LIV     Birth Comments: skin tear scalp on right side of head    Past Medical History:  Diagnosis Date   Abnormal weight gain 06/25/2019   COVID-19 12/04/2021   Hyperlipidemia    Vitamin D deficiency     Past Surgical History:  Procedure Laterality Date   WISDOM TOOTH EXTRACTION      Current Outpatient Medications on File Prior to Visit  Medication Sig Dispense Refill   buPROPion ER (WELLBUTRIN SR) 100 MG 12 hr tablet Take 1 tablet (100 mg total) by mouth 2 (two) times daily. 180 tablet 1   cholecalciferol (VITAMIN D3) 25 MCG (1000 UNIT) tablet Take 1,000 Units by mouth daily.     Levonorgestrel (LILETTA, 52 MG, IU) by Intrauterine route.     sertraline (ZOLOFT) 25 MG tablet Take 1 tablet (25 mg total) by mouth daily. (Patient not taking: Reported on 01/17/2023) 90 tablet 0   No current facility-administered medications on file prior to visit.    Allergies  Allergen Reactions   Sulfacetamide Sodium Other (See Comments)    Per Patient caused damage to her eye to the point she has to get new prescription lenses.  Her eye doctor wanted it noted she can not take these any more.    Social History:  reports that she has never smoked. She has never used smokeless tobacco. She reports that she does not  currently use alcohol. She reports that she does not use drugs.  Family History  Problem Relation Age of Onset   Hyperlipidemia Mother    Hypertension Father    Diabetes Maternal Grandmother    Diabetes Paternal Grandmother    Asthma Neg Hx    Stroke Neg Hx     The following portions of the patient's history were reviewed and updated as appropriate: allergies, current medications, past family history, past medical history, past social history, past surgical history and problem list.  Review of Systems Pertinent items noted in HPI and remainder of comprehensive ROS otherwise negative.  Physical Exam:  BP 110/72   Pulse 84   Resp 16   Ht 5' (1.524 m)   Wt 127 lb (57.6 kg)   BMI 24.80 kg/m  CONSTITUTIONAL: Well-developed, well-nourished female in no acute distress.  HENT:  Normocephalic EYES: Conjunctivae and EOM are normal.  NEUROLOGIC: Alert and oriented to person, place, and time.  PELVIC: Normal appearing external genitalia and urethral meatus; normal appearing vaginal mucosa and cervix. No IUD strings visualized. Many attempts tried including broom, ring forceps and kelly clamps. Unable to remove IUD or visualize strings.   Performed in  the presence of a chaperone.   Assessment and Plan:    1. Intrauterine contraceptive device threads lost, sequela  - US PELVIS TRANSVAGINAL NON-OB (TV ONLY); Future    Julitza Rickles, Artist Pais, [place] for [employer], [place]

## 2023-01-31 NOTE — Progress Notes (Unsigned)
    GYNECOLOGY OFFICE PROCEDURE NOTE  Olivia Gardner is a 32 y.o. [id number] here for IUD removal. No GYN concerns.  Last pap smear was on 06/09/2020 and was normal and hPV neg  IUD Removal  Patient identified, informed consent performed, consent signed.  Patient was in the dorsal lithotomy position, normal external genitalia was noted.  A speculum was placed in the patient's vagina, normal discharge was noted, no lesions. The cervix was visualized, no lesions, no abnormal discharge.  Cervical block was ***. The strings of the IUD were not visible.  {Blank single:19197::"The IUD was removed in its entirety. ","The strings of the IUD were not visualized, so Kelly forceps were introduced into the endometrial cavity and the IUD was grasped and removed in its entirety. ","The IUD was unable to be removed"}  Patient tolerated the procedure well.    Patient {Blank single:19197::"will use *** for contraception.","plans for pregnancy soon and she was told to avoid teratogens, take PNV and folic acid."}  Routine preventative health maintenance measures emphasized.   Radene Gunning, MD, [place] for [facility], [place]

## 2023-02-01 ENCOUNTER — Ambulatory Visit: Payer: 59 | Admitting: Obstetrics and Gynecology

## 2023-02-01 ENCOUNTER — Encounter: Payer: Self-pay | Admitting: Obstetrics and Gynecology

## 2023-02-01 VITALS — BP 111/69 | HR 78 | Resp 16 | Ht 60.0 in | Wt 124.0 lb

## 2023-02-01 DIAGNOSIS — Z30432 Encounter for removal of intrauterine contraceptive device: Secondary | ICD-10-CM | POA: Diagnosis not present

## 2023-02-01 DIAGNOSIS — N920 Excessive and frequent menstruation with regular cycle: Secondary | ICD-10-CM

## 2023-02-01 MED ORDER — TRANEXAMIC ACID 650 MG PO TABS: 1300.0000 mg | ORAL_TABLET | Freq: Three times a day (TID) | ORAL | 2 refills | Status: DC

## 2023-04-10 ENCOUNTER — Ambulatory Visit: Payer: 59 | Admitting: Physician Assistant

## 2023-04-25 ENCOUNTER — Other Ambulatory Visit: Payer: Self-pay | Admitting: Physician Assistant

## 2023-04-25 DIAGNOSIS — F419 Anxiety disorder, unspecified: Secondary | ICD-10-CM

## 2023-04-25 DIAGNOSIS — R4589 Other symptoms and signs involving emotional state: Secondary | ICD-10-CM

## 2023-04-28 ENCOUNTER — Ambulatory Visit
Admission: RE | Admit: 2023-04-28 | Discharge: 2023-04-28 | Disposition: A | Discharge status: Home / Self Care | Payer: 59 | Source: Ambulatory Visit | Attending: Family Medicine | Admitting: Family Medicine

## 2023-04-28 VITALS — BP 111/85 | HR 87 | Temp 98.5°F | Resp 16

## 2023-04-28 DIAGNOSIS — J029 Acute pharyngitis, unspecified: Secondary | ICD-10-CM

## 2023-04-28 LAB — POCT RAPID STREP A (OFFICE): Rapid Strep A Screen: NEGATIVE

## 2023-04-28 MED ORDER — AMOXICILLIN-POT CLAVULANATE 875-125 MG PO TABS: 1.0000 | ORAL_TABLET | Freq: Two times a day (BID) | ORAL | 0 refills | Status: DC

## 2023-04-28 NOTE — ED Triage Notes (Signed)
Patient presents to UC for sore throat since yesterday. No OTC meds. 

## 2023-04-28 NOTE — Discharge Instructions (Addendum)
Instructed patient to take medication as directed with food to completion.  Encouraged increase daily water intake to 64 ounces per day while taking this medication.  Advised if symptoms worsen and/or unresolved please follow-up with PCP or here for further evaluation. 

## 2023-04-28 NOTE — ED Provider Notes (Signed)
[facility]    CSN: [id number] Arrival date & time: 04/28/23  1550      History   Chief Complaint Chief Complaint  Patient presents with   Sore Throat    Entered by patient    HPI Olivia Gardner is a 32 y.o. female.   HPI 32 year old female presents with sore throat for 4-5 day.  Patient reports may have picked up bacterial infection from her husband who was evaluated and treated here earlier this week.  PMH significant for HLD and vitamin D deficiency.  Past Medical History:  Diagnosis Date   Abnormal weight gain 06/25/2019   COVID-19 12/04/2021   Hyperlipidemia    Vitamin D deficiency     Patient Active Problem List   Diagnosis Date Noted   Depressed mood 12/12/2022   Anxiety 12/12/2022   Poor concentration 12/12/2022   Binge eating 12/12/2022   Trouble in sleeping 12/12/2022   Genital labial ulcer 06/21/2022   Overweight (BMI 25.0-29.9) 06/10/2022   IUD (intrauterine device) in place 09/07/2021   Pica in adults 07/31/2020   Elevated TSH 07/31/2020   Other dietary vitamin B12 deficiency anemia 09/24/2019   Post partum thyroiditis 06/26/2019   Vitamin D deficiency 03/28/2016   Low iron stores 03/28/2016   Hyperlipidemia 03/25/2016   Ganglion of right wrist 03/25/2016   Epicondylitis elbow, medial 02/02/2015   Elevated LDL cholesterol level 01/30/2015   Tinnitus 01/30/2015    Past Surgical History:  Procedure Laterality Date   WISDOM TOOTH EXTRACTION      OB History     Gravida  2   Para  2   Term  2   Preterm      AB      Living  2      SAB      IAB      Ectopic      Multiple  0   Live Births  2            Home Medications    Prior to Admission medications   Medication Sig Start Date End Date Taking? Authorizing Provider  amoxicillin-clavulanate (AUGMENTIN) 875-125 MG tablet Take 1 tablet by mouth 2 (two) times daily for 10 days. 04/28/23 05/08/23 Yes Trevor Iha, FNP  Boric Acid Vaginal 600 MG SUPP Place  600 mg vaginally at bedtime. Take for one week, if symptoms do not improve ok to continue for 2 weeks. 01/17/23   Rasch, Victorino Dike I, NP  buPROPion ER (WELLBUTRIN SR) 100 MG 12 hr tablet Take 1 tablet (100 mg total) by mouth 2 (two) times daily. 01/09/23   Breeback, Lonna Cobb, PA-C  cholecalciferol (VITAMIN D3) 25 MCG (1000 UNIT) tablet Take 1,000 Units by mouth daily.    [provider]  sertraline (ZOLOFT) 25 MG tablet TAKE 1 TABLET (25 MG TOTAL) BY MOUTH DAILY. 04/25/23   Breeback, Lesly Rubenstein L, PA-C  tranexamic acid (LYSTEDA) 650 MG TABS tablet Take 2 tablets (1,300 mg total) by mouth 3 (three) times daily. Take during menses for a maximum of five days 02/01/23   [name], MD    Family History Family History  Problem Relation Age of Onset   Hyperlipidemia Mother    Hypertension Father    Diabetes Maternal Grandmother    Diabetes Paternal Grandmother    Asthma Neg Hx    Stroke Neg Hx     Social History Social History   Tobacco Use   Smoking status: Never   Smokeless tobacco: Never  Vaping Use   Vaping Use: Never used  Substance Use Topics   Alcohol use: Not Currently    Alcohol/week: 0.0 standard drinks of alcohol   Drug use: No     Allergies   Sulfacetamide sodium   Review of Systems Review of Systems  HENT:  Positive for congestion and sore throat.   All other systems reviewed and are negative.    Physical Exam Triage Vital Signs ED Triage Vitals [04/28/23 1600]  Enc Vitals Group     BP 111/85     Pulse Rate 87     Resp 16     Temp 98.5 F (36.9 C)     Temp Source Oral     SpO2 98 %     Weight      Height      Head Circumference      Peak Flow      Pain Score      Pain Loc      Pain Edu?      Excl. in GC?    No data found.  Updated Vital Signs BP 111/85 (BP Location: Left Arm)   Pulse 87   Temp 98.5 F (36.9 C) (Oral)   Resp 16   SpO2 98%   Breastfeeding No      Physical Exam Vitals and nursing note reviewed.  Constitutional:       General: She is not in acute distress.    Appearance: Normal appearance. She is well-developed and normal weight. She is not ill-appearing.  HENT:     Head: Normocephalic and atraumatic.     Right Ear: Tympanic membrane, ear canal and external ear normal.     Left Ear: Tympanic membrane, ear canal and external ear normal.     Mouth/Throat:     Mouth: Mucous membranes are moist.     Pharynx: Oropharynx is clear. Uvula midline. Posterior oropharyngeal erythema and uvula swelling present.     Tonsils: 2+ on the right. 2+ on the left.  Eyes:     Extraocular Movements: Extraocular movements intact.     Conjunctiva/sclera: Conjunctivae normal.     Pupils: Pupils are equal, round, and reactive to light.  Cardiovascular:     Rate and Rhythm: Regular rhythm. Bradycardia present.     Heart sounds: Normal heart sounds.  Pulmonary:     Breath sounds: Normal breath sounds. No wheezing, rhonchi or rales.  Musculoskeletal:        General: Normal range of motion.     Cervical back: Normal range of motion and neck supple.  Skin:    General: Skin is warm and dry.  Neurological:     General: No focal deficit present.     Mental Status: She is alert and oriented to person, place, and time. Mental status is at baseline.  Psychiatric:        Mood and Affect: Mood normal.        Behavior: Behavior normal.        Thought Content: Thought content normal.      UC Treatments / Results  Labs (all labs ordered are listed, but only abnormal results are displayed) Labs Reviewed  CULTURE, GROUP A STREP [facility])  POCT RAPID STREP A (OFFICE)    EKG   Radiology No results found.  Procedures Procedures (including critical care time)  Medications Ordered in UC Medications - No data to display  Initial Impression / Assessment and Plan / UC Course  I have reviewed the triage  vital signs and the nursing notes.  Pertinent labs & imaging results that were available during my care of the patient were  reviewed by me and considered in my medical decision making (see chart for details).     MDM: 1.  Acute pharyngitis, unspecified etiology-Rx'd Augmentin 875/125 mg twice daily x 10 days.  Rapid strep negative, throat culture ordered. Instructed patient to take medication as directed with food to completion.  Encouraged increase daily water intake to 64 ounces per day while taking this medication.  Advised if symptoms worsen and/or unresolved please follow-up with PCP or here for further evaluation.  Patient discharged home, hemodynamically stable. Final Clinical Impressions(s) / UC Diagnoses   Final diagnoses:  Acute pharyngitis, unspecified etiology     Discharge Instructions      Instructed patient to take medication as directed with food to completion.  Encouraged increase daily water intake to 64 ounces per day while taking this medication.  Advised if symptoms worsen and/or unresolved please follow-up with PCP or here for further evaluation.     ED Prescriptions     Medication Sig Dispense Auth. Provider   amoxicillin-clavulanate (AUGMENTIN) 875-125 MG tablet Take 1 tablet by mouth 2 (two) times daily for 10 days. 20 tablet Trevor Iha, FNP      PDMP not reviewed this encounter.   Trevor Iha, FNP 04/28/23 [phone]

## 2023-05-01 ENCOUNTER — Ambulatory Visit: Payer: 59 | Admitting: Physician Assistant

## 2023-05-02 ENCOUNTER — Encounter: Payer: Self-pay | Admitting: Physician Assistant

## 2023-05-02 ENCOUNTER — Telehealth (INDEPENDENT_AMBULATORY_CARE_PROVIDER_SITE_OTHER): Payer: 59 | Admitting: Physician Assistant

## 2023-05-02 DIAGNOSIS — R4589 Other symptoms and signs involving emotional state: Secondary | ICD-10-CM

## 2023-05-02 DIAGNOSIS — R632 Polyphagia: Secondary | ICD-10-CM | POA: Diagnosis not present

## 2023-05-02 DIAGNOSIS — R41840 Attention and concentration deficit: Secondary | ICD-10-CM | POA: Diagnosis not present

## 2023-05-02 DIAGNOSIS — F419 Anxiety disorder, unspecified: Secondary | ICD-10-CM

## 2023-05-02 DIAGNOSIS — G479 Sleep disorder, unspecified: Secondary | ICD-10-CM

## 2023-05-02 MED ORDER — BUPROPION HCL ER (SR) 100 MG PO TB12: 100.0000 mg | ORAL_TABLET | Freq: Two times a day (BID) | ORAL | 3 refills | Status: DC

## 2023-05-02 MED ORDER — SERTRALINE HCL 25 MG PO TABS: 25.0000 mg | ORAL_TABLET | Freq: Every day | ORAL | 3 refills | Status: DC

## 2023-05-02 NOTE — Progress Notes (Signed)
..Virtual Visit via Video Note  I connected with Olivia Gardner on 05/02/23 at  1:00 PM EDT by a video enabled telemedicine application and verified that I am speaking with the correct person using two identifiers.  Location: Patient: work Provider: clinic  .Participating in visit:  Patient: Olivia Gardner Provider: Tandy Gaw PA-C   I discussed the limitations of evaluation and management by telemedicine and the availability of in person appointments. The patient expressed understanding and agreed to proceed.  History of Present Illness: Pt is a 32 yo female who calls into the clinic to follow up on depressed mood, anxiety, poor concentration, binge eating.   She is on zoloft and wellbutrin and doing great. The combination has made a positive difference. She does have headaches if she misses a dose. No SI/HC. Work is really stressful and sleep has been worse lately. Once she is out for the summer hopefully and improve even more. At time she still binge eats around her period but not any other time. She hs lost 3lbs. Counseling is helping as well and she has seen a big improvement.    .. Active Ambulatory Problems    Diagnosis Date Noted   Elevated LDL cholesterol level 01/30/2015   Tinnitus 01/30/2015   Epicondylitis elbow, medial 02/02/2015   Hyperlipidemia 03/25/2016   Ganglion of right wrist 03/25/2016   Vitamin D deficiency 03/28/2016   Low iron stores 03/28/2016   Post partum thyroiditis 06/26/2019   Other dietary vitamin B12 deficiency anemia 09/24/2019   Pica in adults 07/31/2020   Elevated TSH 07/31/2020   IUD (intrauterine device) in place 09/07/2021   Overweight (BMI 25.0-29.9) 06/10/2022   Genital labial ulcer 06/21/2022   Depressed mood 12/12/2022   Anxiety 12/12/2022   Poor concentration 12/12/2022   Binge eating 12/12/2022   Trouble in sleeping 12/12/2022   Resolved Ambulatory Problems    Diagnosis Date Noted   Tendonitis 01/30/2015   Shortness of breath  01/30/2015   Heart burn 01/30/2015   Spotting 03/30/2015   No energy 03/25/2016   Supervision of low-risk first pregnancy 08/28/2018   Rubella non-immune status, antepartum 09/01/2018   Post-dates pregnancy 03/31/2019   Hair loss 06/25/2019   Abnormal weight gain 06/25/2019   Supervision of other normal pregnancy, antepartum 02/03/2021   Pelvic pain affecting pregnancy 05/17/2021   Anemia, antepartum 07/19/2021   Pregnancy affected by fetal growth restriction 07/29/2021   Poor fetal growth 08/14/2021   Encounter for IUD insertion 08/15/2021   Vaginal delivery 08/16/2021   Past Medical History:  Diagnosis Date   COVID-19 12/04/2021     Observations/Objective: No acute distress Normal mood and appearance Normal breathing  ..    05/02/2023    1:16 PM 01/09/2023    9:54 AM 12/12/2022    9:12 AM 06/10/2022   10:46 AM 07/31/2020    9:18 AM  Depression screen PHQ 2/9  Decreased Interest 0 1 3 1 1   Down, Depressed, Hopeless 1 1 3 1 1   PHQ - 2 Score 1 2 6 2 2   Altered sleeping 1 0 3 1   Tired, decreased energy 1 0 3 1   Change in appetite 0 0 1    Feeling bad or failure about yourself  0 1 3 1    Trouble concentrating 0 0 3 0   Moving slowly or fidgety/restless 0 0 1 0   Suicidal thoughts 0 0 0 0   PHQ-9 Score 3 3 20 5    Difficult doing work/chores  Somewhat  difficult Very difficult Somewhat difficult    ..    05/02/2023    1:36 PM 01/09/2023    9:54 AM 12/12/2022    9:13 AM 06/10/2022   10:46 AM  GAD 7 : Generalized Anxiety Score  Nervous, Anxious, on Edge 1 2 3 1   Control/stop worrying 0 2 3 1   Worry too much - different things 0 2 3 1   Trouble relaxing 0 1 3 1   Restless 0 0 1 0  Easily annoyed or irritable 1 1 3 1   Afraid - awful might happen 0 0 1 0  Total GAD 7 Score 2 8 17 5   Anxiety Difficulty Not difficult at all Somewhat difficult Very difficult Somewhat difficult      Assessment and Plan: Marland KitchenMarland KitchenDiagnoses and all orders for this visit:  Anxiety -      buPROPion ER (WELLBUTRIN SR) 100 MG 12 hr tablet; Take 1 tablet (100 mg total) by mouth 2 (two) times daily. -     sertraline (ZOLOFT) 25 MG tablet; Take 1 tablet (25 mg total) by mouth daily.  Binge eating -     buPROPion ER (WELLBUTRIN SR) 100 MG 12 hr tablet; Take 1 tablet (100 mg total) by mouth 2 (two) times daily.  Poor concentration -     buPROPion ER (WELLBUTRIN SR) 100 MG 12 hr tablet; Take 1 tablet (100 mg total) by mouth 2 (two) times daily.  Depressed mood -     buPROPion ER (WELLBUTRIN SR) 100 MG 12 hr tablet; Take 1 tablet (100 mg total) by mouth 2 (two) times daily. -     sertraline (ZOLOFT) 25 MG tablet; Take 1 tablet (25 mg total) by mouth daily.   PHQ/GAD looks great Wellbutrin and zoloft refilled Counseling helping, continue.  Continue with mindfullness and regular exercise.  Follow up in 6 months or as needed.   Follow Up Instructions:    I discussed the assessment and treatment plan with the patient. The patient was provided an opportunity to ask questions and all were answered. The patient agreed with the plan and demonstrated an understanding of the instructions.   The patient was advised to call back or seek an in-person evaluation if the symptoms worsen or if the condition fails to improve as anticipated.   Tandy Gaw, PA-C

## 2023-05-02 NOTE — Progress Notes (Signed)
An attempt to reach was made  at 11:30

## 2023-05-05 LAB — CULTURE, GROUP A STREP (THRC)

## 2023-09-29 ENCOUNTER — Encounter: Payer: Self-pay | Admitting: Physician Assistant

## 2023-10-02 ENCOUNTER — Encounter: Payer: Self-pay | Admitting: Physician Assistant

## 2023-10-02 ENCOUNTER — Ambulatory Visit: Payer: 59 | Admitting: Physician Assistant

## 2023-10-02 VITALS — BP 115/68 | HR 106 | Temp 100.0°F | Ht 65.5 in | Wt 126.2 lb

## 2023-10-02 DIAGNOSIS — R059 Cough, unspecified: Secondary | ICD-10-CM

## 2023-10-02 DIAGNOSIS — J40 Bronchitis, not specified as acute or chronic: Secondary | ICD-10-CM | POA: Diagnosis not present

## 2023-10-02 DIAGNOSIS — J329 Chronic sinusitis, unspecified: Secondary | ICD-10-CM | POA: Diagnosis not present

## 2023-10-02 LAB — POC COVID19 BINAXNOW: SARS Coronavirus 2 Ag: NEGATIVE

## 2023-10-02 MED ORDER — PREDNISONE 50 MG PO TABS: ORAL_TABLET | ORAL | 0 refills | Status: DC

## 2023-10-02 MED ORDER — AZITHROMYCIN 250 MG PO TABS: ORAL_TABLET | ORAL | 0 refills | Status: DC

## 2023-10-02 NOTE — Progress Notes (Signed)
Acute Office Visit  Subjective:     Patient ID: Olivia Gardner, female    DOB: [birth year], 32 y.o.   MRN: [id number]  Chief Complaint  Patient presents with   Cough    C/o  cough, chest congestion ,headache, racing HR, chills/ hot sensation x  Thursday 09/28/2023 seen at urgent care 09/30/2023.    HPI Patient is in today for follow up on influenza like illness. Her symptoms started Thursday night into Friday. She went to UC on 10/5. She was given symptomatic care after flu/covid/strep tested negative. She has not had her flu or covid vaccine. She has had covid and felt different than this. Her cough is productive some of the time. She continues to have chills and sweats. She has had a fever 100-102 off and on. She is alternating tylenol and ibuprofen. No SOB. She has a terrible headache. She is blowing out yellow discharge from nose.   .. Active Ambulatory Problems    Diagnosis Date Noted   Elevated LDL cholesterol level 01/30/2015   Tinnitus 01/30/2015   Epicondylitis elbow, medial 02/02/2015   Hyperlipidemia 03/25/2016   Ganglion of right wrist 03/25/2016   Vitamin D deficiency 03/28/2016   Low iron stores 03/28/2016   Post partum thyroiditis 06/26/2019   Other dietary vitamin B12 deficiency anemia 09/24/2019   Pica in adults 07/31/2020   Elevated TSH 07/31/2020   IUD (intrauterine device) in place 09/07/2021   Overweight (BMI 25.0-29.9) 06/10/2022   Genital labial ulcer 06/21/2022   Depressed mood 12/12/2022   Anxiety 12/12/2022   Poor concentration 12/12/2022   Binge eating 12/12/2022   Trouble in sleeping 12/12/2022   Resolved Ambulatory Problems    Diagnosis Date Noted   Tendonitis 01/30/2015   Shortness of breath 01/30/2015   Heart burn 01/30/2015   Spotting 03/30/2015   No energy 03/25/2016   Supervision of low-risk first pregnancy 08/28/2018   Rubella non-immune status, antepartum 09/01/2018   Post-dates pregnancy 03/31/2019   Hair loss 06/25/2019    Abnormal weight gain 06/25/2019   Supervision of other normal pregnancy, antepartum 02/03/2021   Pelvic pain affecting pregnancy 05/17/2021   Anemia, antepartum 07/19/2021   Pregnancy affected by fetal growth restriction 07/29/2021   Poor fetal growth 08/14/2021   Encounter for IUD insertion 08/15/2021   Vaginal delivery 08/16/2021   Past Medical History:  Diagnosis Date   COVID-19 12/04/2021     ROS See HPI.      Objective:    BP 115/68   Pulse (!) 106   Temp 100 F (37.8 C)   Ht 5' 5.5" (1.664 m)   Wt 126 lb 4 oz (57.3 kg)   SpO2 96%   BMI 20.69 kg/m  BP Readings from Last 3 Encounters:  10/02/23 115/68  04/28/23 111/85  02/01/23 111/69   Wt Readings from Last 3 Encounters:  10/02/23 126 lb 4 oz (57.3 kg)  02/01/23 124 lb (56.2 kg)  01/17/23 127 lb (57.6 kg)      Physical Exam Constitutional:      Appearance: Normal appearance.  HENT:     Head: Normocephalic.     Right Ear: Ear canal and external ear normal. There is no impacted cerumen.     Left Ear: Ear canal and external ear normal. There is no impacted cerumen.     Nose: Nose normal. No congestion or rhinorrhea.     Mouth/Throat:     Mouth: Mucous membranes are moist.     Pharynx: Posterior oropharyngeal erythema  present. No oropharyngeal exudate.  Eyes:     General:        Right eye: No discharge.        Left eye: No discharge.     Extraocular Movements: Extraocular movements intact.     Conjunctiva/sclera: Conjunctivae normal.     Pupils: Pupils are equal, round, and reactive to light.  Neck:     Vascular: No carotid bruit.  Cardiovascular:     Rate and Rhythm: Normal rate and regular rhythm.     Pulses: Normal pulses.     Heart sounds: Normal heart sounds.  Pulmonary:     Effort: Pulmonary effort is normal.     Breath sounds: Normal breath sounds.     Comments: Dry to wet cough on exam Musculoskeletal:     Cervical back: Normal range of motion and neck supple. Tenderness present. No  rigidity.     Right lower leg: No edema.     Left lower leg: No edema.  Lymphadenopathy:     Cervical: Cervical adenopathy present.  Skin:    Comments: Clammy and warm  Neurological:     General: No focal deficit present.     Mental Status: She is alert and oriented to person, place, and time.  Psychiatric:        Mood and Affect: Mood normal.      Results for orders placed or performed in visit on 10/02/23  POC COVID-19  Result Value Ref Range   SARS Coronavirus 2 Ag Negative Negative        Assessment & Plan:  Marland KitchenMarland KitchenVearl Gardner was seen today for cough.  Diagnoses and all orders for this visit:  Sinobronchitis -     azithromycin (ZITHROMAX Z-PAK) 250 MG tablet; Take 2 tablets (500 mg) on  Day 1,  followed by 1 tablet (250 mg) once daily on Days 2 through 5. -     predniSONE (DELTASONE) 50 MG tablet; One tab PO daily for 5 days.  Cough, unspecified type -     POC COVID-19 -     azithromycin (ZITHROMAX Z-PAK) 250 MG tablet; Take 2 tablets (500 mg) on  Day 1,  followed by 1 tablet (250 mg) once daily on Days 2 through 5. -     predniSONE (DELTASONE) 50 MG tablet; One tab PO daily for 5 days.   Covid negative in office 6 days of symptoms Start zpak and prednisone  Rest and hydrate Written out of work for another day Go back on Wednesday Delsym for cough  Tandy Gaw, PA-C

## 2023-10-02 NOTE — Patient Instructions (Signed)

## 2024-03-05 ENCOUNTER — Telehealth: Payer: Self-pay | Admitting: Physician Assistant

## 2024-03-05 NOTE — Telephone Encounter (Signed)
 Sent pt MyChart message.

## 2024-03-05 NOTE — Telephone Encounter (Signed)
 Copied from CRM [id number]. Topic: Appointments - Scheduling Inquiry for Clinic >> Mar 05, 2024  1:16 PM Fredrica W wrote: Reason for CRM: Scheduled patient for physical. Wanted to make sure labs can be done at time of physical. States some times they want her to have them done prior. Thank You

## 2024-03-08 ENCOUNTER — Ambulatory Visit (INDEPENDENT_AMBULATORY_CARE_PROVIDER_SITE_OTHER): Admitting: Physician Assistant

## 2024-03-08 VITALS — BP 110/65 | HR 73 | Ht 65.5 in | Wt 129.0 lb

## 2024-03-08 DIAGNOSIS — F419 Anxiety disorder, unspecified: Secondary | ICD-10-CM

## 2024-03-08 DIAGNOSIS — Z0000 Encounter for general adult medical examination without abnormal findings: Secondary | ICD-10-CM | POA: Diagnosis not present

## 2024-03-08 DIAGNOSIS — E559 Vitamin D deficiency, unspecified: Secondary | ICD-10-CM

## 2024-03-08 DIAGNOSIS — R632 Polyphagia: Secondary | ICD-10-CM | POA: Diagnosis not present

## 2024-03-08 DIAGNOSIS — R41840 Attention and concentration deficit: Secondary | ICD-10-CM

## 2024-03-08 DIAGNOSIS — R4589 Other symptoms and signs involving emotional state: Secondary | ICD-10-CM

## 2024-03-08 MED ORDER — SERTRALINE HCL 25 MG PO TABS: 25.0000 mg | ORAL_TABLET | Freq: Every day | ORAL | 3 refills | Status: DC

## 2024-03-08 MED ORDER — BUPROPION HCL ER (SR) 100 MG PO TB12: 100.0000 mg | ORAL_TABLET | Freq: Two times a day (BID) | ORAL | 3 refills | Status: DC

## 2024-03-08 NOTE — Patient Instructions (Signed)

## 2024-03-08 NOTE — Progress Notes (Signed)
 Complete physical exam  Patient: Olivia Gardner   DOB: 1991/09/29   33 y.o. Female  MRN: [id number]  Subjective:    Chief Complaint  Patient presents with   Annual Exam    Tiffany MARICE GUIDONE is a 33 y.o. female who presents today for a complete physical exam. She reports consuming a general diet. The patient does not participate in regular exercise at present. She generally feels well. She reports sleeping well. She does not have additional problems to discuss today.    Most recent fall risk assessment:    03/08/2024    9:49 AM  Fall Risk   Falls in the past year? 0  Number falls in past yr: 0  Injury with Fall? 0  Risk for fall due to : No Fall Risks  Follow up Falls evaluation completed     Most recent depression screenings:    03/08/2024   11:46 AM 05/02/2023    1:16 PM  PHQ 2/9 Scores  PHQ - 2 Score 2 1  PHQ- 9 Score 5 3    Vision:Within last year and Dental: No current dental problems and Receives regular dental care  Patient Active Problem List   Diagnosis Date Noted   Depressed mood 12/12/2022   Anxiety 12/12/2022   Poor concentration 12/12/2022   Binge eating 12/12/2022   Trouble in sleeping 12/12/2022   Genital labial ulcer 06/21/2022   Overweight (BMI 25.0-29.9) 06/10/2022   IUD (intrauterine device) in place 09/07/2021   Pica in adults 07/31/2020   Elevated TSH 07/31/2020   Other dietary vitamin B12 deficiency anemia 09/24/2019   Post partum thyroiditis 06/26/2019   Vitamin D deficiency 03/28/2016   Low iron stores 03/28/2016   Hyperlipidemia 03/25/2016   Ganglion of right wrist 03/25/2016   Epicondylitis elbow, medial 02/02/2015   Elevated LDL cholesterol level 01/30/2015   Tinnitus 01/30/2015   Past Medical History:  Diagnosis Date   Abnormal weight gain 06/25/2019   COVID-19 12/04/2021   Hyperlipidemia    Vitamin D deficiency    Past Surgical History:  Procedure Laterality Date   WISDOM TOOTH EXTRACTION     Family History  Problem  Relation Age of Onset   Hyperlipidemia Mother    Hypertension Father    Diabetes Maternal Grandmother    Diabetes Paternal Grandmother    Asthma Neg Hx    Stroke Neg Hx    Allergies  Allergen Reactions   Sulfacetamide Sodium Other (See Comments)    Per Patient caused damage to her eye to the point she has to get new prescription lenses.  Her eye doctor wanted it noted she can not take these any more.      Patient Care Team: Nolene Ebbs as PCP - General (Family Medicine)   Outpatient Medications Prior to Visit  Medication Sig   cholecalciferol (VITAMIN D3) 25 MCG (1000 UNIT) tablet Take 1,000 Units by mouth daily.   [DISCONTINUED] buPROPion ER (WELLBUTRIN SR) 100 MG 12 hr tablet Take 1 tablet (100 mg total) by mouth 2 (two) times daily.   [DISCONTINUED] sertraline (ZOLOFT) 25 MG tablet Take 1 tablet (25 mg total) by mouth daily.   [DISCONTINUED] azithromycin (ZITHROMAX Z-PAK) 250 MG tablet Take 2 tablets (500 mg) on  Day 1,  followed by 1 tablet (250 mg) once daily on Days 2 through 5.   [DISCONTINUED] Boric Acid Vaginal 600 MG SUPP Place 600 mg vaginally at bedtime. Take for one week, if symptoms do not improve ok to  continue for 2 weeks. (Patient not taking: Reported on 10/02/2023)   [DISCONTINUED] predniSONE (DELTASONE) 50 MG tablet One tab PO daily for 5 days.   [DISCONTINUED] tranexamic acid (LYSTEDA) 650 MG TABS tablet Take 2 tablets (1,300 mg total) by mouth 3 (three) times daily. Take during menses for a maximum of five days (Patient not taking: Reported on 10/02/2023)   No facility-administered medications prior to visit.    Review of Systems  All other systems reviewed and are negative.         Objective:     BP 110/65   Pulse 73   Ht 5' 5.5" (1.664 m)   Wt 129 lb (58.5 kg)   SpO2 100%   BMI 21.14 kg/m  BP Readings from Last 3 Encounters:  03/08/24 110/65  10/02/23 115/68  04/28/23 111/85   Wt Readings from Last 3 Encounters:  03/08/24 129 lb  (58.5 kg)  10/02/23 126 lb 4 oz (57.3 kg)  02/01/23 124 lb (56.2 kg)      Physical Exam  BP 110/65   Pulse 73   Ht 5' 5.5" (1.664 m)   Wt 129 lb (58.5 kg)   SpO2 100%   BMI 21.14 kg/m   General Appearance:    Alert, cooperative, no distress, appears stated age  Head:    Normocephalic, without obvious abnormality, atraumatic  Eyes:    PERRL, conjunctiva/corneas clear, EOM's intact, fundi    benign, both eyes  Ears:    Normal TM's and external ear canals, both ears  Nose:   Nares normal, septum midline, mucosa normal, no drainage    or sinus tenderness  Throat:   Lips, mucosa, and tongue normal; teeth and gums normal  Neck:   Supple, symmetrical, trachea midline, no adenopathy;    thyroid:  no enlargement/tenderness/nodules; no carotid   bruit or JVD  Back:     Symmetric, no curvature, ROM normal, no CVA tenderness  Lungs:     Clear to auscultation bilaterally, respirations unlabored  Chest Wall:    No tenderness or deformity   Heart:    Regular rate and rhythm, S1 and S2 normal, no murmur, rub   or gallop     Abdomen:     Soft, non-tender, bowel sounds active all four quadrants,    no masses, no organomegaly        Extremities:   Extremities normal, atraumatic, no cyanosis or edema  Pulses:   2+ and symmetric all extremities  Skin:   Skin color, texture, turgor normal, no rashes or lesions  Lymph nodes:   Cervical, supraclavicular, and axillary nodes normal  Neurologic:   CNII-XII intact, normal strength, sensation and reflexes    throughout      Assessment & Plan:    Routine Health Maintenance and Physical Exam  Immunization History  Administered Date(s) Administered   Influenza,inj,Quad PF,6+ Mos 10/02/2018, 10/05/2021   MMR 04/03/2019   PFIZER(Purple Top)SARS-COV-2 Vaccination 02/20/2020, 03/17/2020, 12/06/2020   PPD Test 08/27/2016   Tdap 01/01/2019, 06/16/2021    Health Maintenance  Topic Date Due   COVID-19 Vaccine (4 - 2024-25 season) 03/24/2024  (Originally 08/27/2023)   INFLUENZA VACCINE  03/25/2024 (Originally 07/27/2023)   Cervical Cancer Screening (HPV/Pap Cotest)  06/09/2025   DTaP/Tdap/Td (3 - Td or Tdap) 06/17/2031   Hepatitis C Screening  Completed   HIV Screening  Completed   HPV VACCINES  Aged Out    Discussed health benefits of physical activity, and encouraged her to engage in regular exercise  appropriate for her age and condition. Marland KitchenElmarie Shiley M was seen today for annual exam.  Diagnoses and all orders for this visit:  Routine general medical examination at a health care facility -     CMP14+EGFR -     Lipid Panel With LDL/HDL Ratio -     VITAMIN D 25 Hydroxy (Vit-D Deficiency, Fractures) -     CBC w/Diff/Platelet  Anxiety -     sertraline (ZOLOFT) 25 MG tablet; Take 1 tablet (25 mg total) by mouth daily. -     buPROPion ER (WELLBUTRIN SR) 100 MG 12 hr tablet; Take 1 tablet (100 mg total) by mouth 2 (two) times daily.  Depressed mood -     sertraline (ZOLOFT) 25 MG tablet; Take 1 tablet (25 mg total) by mouth daily. -     buPROPion ER (WELLBUTRIN SR) 100 MG 12 hr tablet; Take 1 tablet (100 mg total) by mouth 2 (two) times daily.  Binge eating -     buPROPion ER (WELLBUTRIN SR) 100 MG 12 hr tablet; Take 1 tablet (100 mg total) by mouth 2 (two) times daily.  Poor concentration -     buPROPion ER (WELLBUTRIN SR) 100 MG 12 hr tablet; Take 1 tablet (100 mg total) by mouth 2 (two) times daily.  Vitamin D deficiency -     VITAMIN D 25 Hydroxy (Vit-D Deficiency, Fractures)   .Marland Kitchen Discussed 150 minutes of exercise a week.  Encouraged vitamin D 1000 units and Calcium 1300mg  or 4 servings of dairy a day.  Fasting labs ordered today PHQ/GAD to goal refilled zoloft and wellbutrin Pap UTD Vitals look great Flu and covid vaccine declined   Return in about 1 year (around 03/08/2025).     Tandy Gaw, PA-C

## 2024-03-09 LAB — LIPID PANEL WITH LDL/HDL RATIO
Cholesterol, Total: 239 mg/dL — ABNORMAL HIGH (ref 100–199)
HDL: 62 mg/dL (ref >39)
LDL Chol Calc (NIH): 153 mg/dL — ABNORMAL HIGH (ref 0–99)
LDL/HDL Ratio: 2.5 ratio (ref 0.0–3.2)
Triglycerides: 135 mg/dL (ref 0–149)
VLDL Cholesterol Cal: 24 mg/dL (ref 5–40)

## 2024-03-09 LAB — CMP14+EGFR
ALT: 8 IU/L (ref 0–32)
AST: 15 IU/L (ref 0–40)
Albumin: 4.6 g/dL (ref 3.9–4.9)
Alkaline Phosphatase: 52 IU/L (ref 44–121)
BUN/Creatinine Ratio: 12 (ref 9–23)
BUN: 11 mg/dL (ref 6–20)
Bilirubin Total: 0.6 mg/dL (ref 0.0–1.2)
CO2: 21 mmol/L (ref 20–29)
Calcium: 9.1 mg/dL (ref 8.7–10.2)
Chloride: 104 mmol/L (ref 96–106)
Creatinine, Ser: 0.89 mg/dL (ref 0.57–1.00)
Globulin, Total: 2.1 g/dL (ref 1.5–4.5)
Glucose: 82 mg/dL (ref 70–99)
Potassium: 4.6 mmol/L (ref 3.5–5.2)
Sodium: 141 mmol/L (ref 134–144)
Total Protein: 6.7 g/dL (ref 6.0–8.5)
eGFR: 87 mL/min/{1.73_m2} (ref >59)

## 2024-03-09 LAB — CBC WITH DIFFERENTIAL/PLATELET
Basophils Absolute: 0 10*3/uL (ref 0.0–0.2)
Basos: 1 %
EOS (ABSOLUTE): 0.1 10*3/uL (ref 0.0–0.4)
Eos: 2 %
Hematocrit: 36.1 % (ref 34.0–46.6)
Hemoglobin: 11.8 g/dL (ref 11.1–15.9)
Immature Grans (Abs): 0 10*3/uL (ref 0.0–0.1)
Immature Granulocytes: 0 %
Lymphocytes Absolute: 1.9 10*3/uL (ref 0.7–3.1)
Lymphs: 31 %
MCH: 27.9 pg (ref 26.6–33.0)
MCHC: 32.7 g/dL (ref 31.5–35.7)
MCV: 85 fL (ref 79–97)
Monocytes Absolute: 0.6 10*3/uL (ref 0.1–0.9)
Monocytes: 10 %
Neutrophils Absolute: 3.5 10*3/uL (ref 1.4–7.0)
Neutrophils: 56 %
Platelets: 202 10*3/uL (ref 150–450)
RBC: 4.23 x10E6/uL (ref 3.77–5.28)
RDW: 14 % (ref 11.7–15.4)
WBC: 6.2 10*3/uL (ref 3.4–10.8)

## 2024-03-09 LAB — VITAMIN D 25 HYDROXY (VIT D DEFICIENCY, FRACTURES): Vit D, 25-Hydroxy: 29.9 ng/mL — ABNORMAL LOW (ref 30.0–100.0)

## 2024-03-11 ENCOUNTER — Encounter: Payer: Self-pay | Admitting: Physician Assistant

## 2024-03-11 NOTE — Progress Notes (Signed)
 Olivia Gardner,   Vitamin D is still low. Add 2000 units to current dose and take with dairy for better absorption.  Kidney, liver, glucose look great.   TG have improved.  HDL, good cholesterol, looks GREAT.  LDL, bad cholesterol, not to goal and elevated.   LDL does continue to elevated over the years. I would consider a low dose statin to help get LDL numbers to goal. Thoughts?

## 2024-03-12 ENCOUNTER — Ambulatory Visit: Admitting: Obstetrics and Gynecology

## 2024-03-28 ENCOUNTER — Encounter: Payer: Self-pay | Admitting: Obstetrics and Gynecology

## 2024-03-28 ENCOUNTER — Other Ambulatory Visit (HOSPITAL_COMMUNITY)
Admission: RE | Admit: 2024-03-28 | Discharge: 2024-03-28 | Disposition: A | Discharge status: Home / Self Care | Source: Ambulatory Visit | Attending: Obstetrics and Gynecology | Admitting: Obstetrics and Gynecology

## 2024-03-28 ENCOUNTER — Ambulatory Visit: Admitting: Obstetrics and Gynecology

## 2024-03-28 VITALS — BP 117/74 | HR 67 | Ht 65.5 in | Wt 131.0 lb

## 2024-03-28 DIAGNOSIS — Z01419 Encounter for gynecological examination (general) (routine) without abnormal findings: Secondary | ICD-10-CM | POA: Diagnosis present

## 2024-03-28 NOTE — Progress Notes (Signed)
   ANNUAL EXAM Patient name: Olivia Gardner MRN [id number]  Date of birth: Aug 29, 1991 Chief Complaint:   Annual Exam  History of Present Illness:   Olivia Gardner is a 33 y.o. [phone] female being seen today for a routine annual exam.   Current concerns: None. Issues she had previously with IUD resolved with removal of IUD.   Current birth control: Condoms - hope is for spouse to have vasectomy this year.   Patient's last menstrual period was 02/02/2024.   Last Pap/Pap History:  05/2020: pap/HPV wnl   Health Maintenance Due  Topic Date Due   COVID-19 Vaccine (4 - 2024-25 season) 08/27/2023    Review of Systems:   Pertinent items are noted in HPI Denies any headaches, blurred vision, fatigue, shortness of breath, chest pain, abdominal pain, abnormal vaginal discharge/itching/odor/irritation, problems with periods, bowel movements, urination, or intercourse unless otherwise stated above.  Pertinent History Reviewed:  Reviewed past medical,surgical, social and family history.  Reviewed problem list, medications and allergies. Physical Assessment:   Vitals:   03/28/24 1552  BP: 117/74  Pulse: 67  Weight: 131 lb (59.4 kg)  Height: 5' 5.5" (1.664 m)  Body mass index is 21.47 kg/m.   Physical Examination:  General appearance - well appearing, and in no distress Mental status - alert, oriented to person, place, and time Psych:  She has a normal mood and affect Skin - warm and dry, normal color, no suspicious lesions noted Chest - effort normal Heart - normal rate  Breasts - breasts appear normal, no suspicious masses, no skin or nipple changes or axillary nodes Abdomen - soft, nontender, nondistended, no masses or organomegaly Pelvic -  VULVA: normal appearing vulva with no masses, tenderness or lesions  VAGINA: normal appearing vagina with normal color and discharge, no lesions  CERVIX: normal appearing cervix without discharge or lesions, no CMT UTERUS: uterus is  felt to be normal size, shape, consistency and nontender  ADNEXA: No adnexal masses or tenderness noted. Extremities:  No swelling or varicosities noted  Chaperone present for exam  No results found for this or any previous visit (from the past 24 hours).  Assessment & Plan:  [name] was seen today for annual exam.  Diagnoses and all orders for this visit:  Encounter for annual routine gynecological examination -     Cytology - PAP( [facility])  - Cervical cancer screening: Discussed guidelines. Pap with HPV done - Gardasil:  Did not receive - GC/CT: declines - Birth Control: Condoms - Breast Health: Encouraged self breast awareness/SBE. Teaching provided.  - F/U 12 months and prn     No orders of the defined types were placed in this encounter.   Meds: No orders of the defined types were placed in this encounter.   Follow-up: Return in about 1 year (around 03/28/2025) for annual.  [name], MD 03/28/2024 4:04 PM

## 2024-04-01 LAB — CYTOLOGY - PAP
Comment: NEGATIVE
Diagnosis: NEGATIVE
High risk HPV: NEGATIVE

## 2024-04-02 ENCOUNTER — Encounter: Payer: Self-pay | Admitting: Obstetrics and Gynecology

## 2024-07-08 ENCOUNTER — Ambulatory Visit: Admitting: Physician Assistant

## 2024-07-08 ENCOUNTER — Encounter: Payer: Self-pay | Admitting: Physician Assistant

## 2024-07-08 VITALS — BP 135/75 | HR 90 | Ht 65.0 in | Wt 130.0 lb

## 2024-07-08 DIAGNOSIS — L219 Seborrheic dermatitis, unspecified: Secondary | ICD-10-CM | POA: Diagnosis not present

## 2024-07-08 DIAGNOSIS — R41840 Attention and concentration deficit: Secondary | ICD-10-CM

## 2024-07-08 DIAGNOSIS — R4589 Other symptoms and signs involving emotional state: Secondary | ICD-10-CM

## 2024-07-08 DIAGNOSIS — F419 Anxiety disorder, unspecified: Secondary | ICD-10-CM

## 2024-07-08 MED ORDER — BUPROPION HCL ER (XL) 150 MG PO TB24: 150.0000 mg | ORAL_TABLET | ORAL | 0 refills | Status: DC

## 2024-07-08 MED ORDER — CLOTRIMAZOLE-BETAMETHASONE 1-0.05 % EX CREA: 1.0000 | TOPICAL_CREAM | Freq: Every day | CUTANEOUS | 0 refills | Status: AC

## 2024-07-08 NOTE — Patient Instructions (Signed)
Seborrheic Dermatitis, Adult Seborrheic dermatitis is a skin disease that causes red, scaly patches. It often occurs on the scalp, where it may be called dandruff. The patches may also appear on other parts of the body. Skin patches tend to occur where there are a lot of oil glands in the skin. Areas of the body that may be affected include: The scalp. The face, eyebrows, and ears. The area around a beard. Skin folds of the body. This includes the armpits, groin, and buttocks. The chest. The condition is often long-lasting (chronic). It may come and go for no known reason. It may be activated by a trigger, such as: Cold weather. Being out in the sun. Stress. Drinking alcohol. What are the causes? The cause of this condition is not known. It may be related to having too much yeast on the skin or changes in how your body's disease-fighting system (immune system) works. What increases the risk? You may be more likely to develop this condition if: You have a weak immune system. You are 75 years old or older. You have other conditions, such as: Human immunodeficiency virus (HIV) or acquired immunodeficiency virus (AIDS). Parkinson's disease. Mood disorders, such as depression. Liver problems. Obesity. What are the signs or symptoms? Symptoms of this condition include: Thick scales on the scalp. Redness on the face or in the armpits. Skin that is flaky. The flakes may be white or yellow. Skin that seems oily or dry but is not helped with moisturizers. Itching or burning in the affected areas. How is this diagnosed? This condition is diagnosed with a medical history and physical exam. A sample of your skin may be tested (skin biopsy). You may need to see a skin specialist (dermatologist). How is this treated? There is no cure for this condition, but treatment can help to manage the symptoms. You may get treatment to remove scales, lower the risk of skin infection, and reduce swelling or  itching. Treatment may include: Medicated shampoos, moisturizing creams, or ointments. Creams that reduce skin yeast. Creams that reduce swelling and irritation (steroids). Follow these instructions at home: Skin care Use any medicated shampoo, skin creams, or ointments only as told by your health care provider. Do not use skin products that contain alcohol. Take lukewarm baths or showers. Avoid very hot water. When you are outside, wear a hat and clothes that block UV light. General instructions Apply over-the-counter and prescription medicines only as told by your health care provider. Learn what triggers your symptoms so you can avoid these things. Use techniques for stress reduction, such as meditation or yoga. Do not drink alcohol if your health care provider tells you not to drink. Keep all follow-up visits. Your health care provider will check your skin to make sure the treatments are helping. Where to find more information American Academy of Dermatology: [employer] Contact a health care provider if: Your symptoms do not get better with treatment. Your symptoms get worse. You have new symptoms. Get help right away if: Your condition quickly gets worse, even with treatment. This information is not intended to replace advice given to you by your health care provider. Make sure you discuss any questions you have with your health care provider. Document Revised: 05/13/2022 Document Reviewed: 05/13/2022 Elsevier Patient Education  2024 [employer].

## 2024-07-08 NOTE — Progress Notes (Unsigned)
 Acute Office Visit  Subjective:     Patient ID: Olivia Gardner, female    DOB: September 11, 1991, 33 y.o.   MRN: [id number]  Chief Complaint  Patient presents with   Medical Management of Chronic Issues    HPI Patient is in today for red, itchy rash under left nare that comes and goes. She has tried vasoline, neosporin, alcohol, face wash but not had any luck to resolve it. Some days worse than others.   She is doing much better with depressed mood, anxiety, concentration. 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 She is excited about where this will lead. She would like to stop taking zoloft  and would like to only take wellbutrin  once a day. She often forgets the 2nd dose of wellbutrin .   ROS See HPI.      Objective:    BP 135/75   Pulse 90   Ht 5' 5 (1.651 m)   Wt 130 lb (59 kg)   SpO2 100%   BMI 21.63 kg/m  BP Readings from Last 3 Encounters:  07/08/24 135/75  03/28/24 117/74  03/08/24 110/65   Wt Readings from Last 3 Encounters:  07/08/24 130 lb (59 kg)  03/28/24 131 lb (59.4 kg)  03/08/24 129 lb (58.5 kg)    ..    03/08/2024   11:46 AM 05/02/2023    1:16 PM 01/09/2023    9:54 AM 12/12/2022    9:12 AM 06/10/2022   10:46 AM  Depression screen PHQ 2/9  Decreased Interest 1 0 1 3 1   Down, Depressed, Hopeless 1 1 1 3 1   PHQ - 2 Score 2 1 2 6 2   Altered sleeping 1 1 0 3 1  Tired, decreased energy 1 1 0 3 1  Change in appetite 1 0 0 1   Feeling bad or failure about yourself  0 0 1 3 1   Trouble concentrating 0 0 0 3 0  Moving slowly or fidgety/restless 0 0 0 1 0  Suicidal thoughts 0 0 0 0 0  PHQ-9 Score 5 3 3 20 5   Difficult doing work/chores Somewhat difficult  Somewhat difficult Very difficult Somewhat difficult   .SABRA    03/08/2024   11:47 AM 05/02/2023    1:36 PM 01/09/2023    9:54 AM 12/12/2022    9:13 AM  GAD 7 : Generalized Anxiety Score  Nervous, Anxious, on Edge 1 1 2 3   Control/stop worrying 1 0 2 3  Worry too much - different things  1 0 2 3  Trouble relaxing 0 0 1 3  Restless 0 0 0 1  Easily annoyed or irritable 0 1 1 3   Afraid - awful might happen 1 0 0 1  Total GAD 7 Score 4 2 8 17   Anxiety Difficulty Somewhat difficult Not difficult at all Somewhat difficult Very difficult      Physical Exam Constitutional:      Appearance: Normal appearance.  HENT:     Head: Normocephalic.  Cardiovascular:     Rate and Rhythm: Normal rate.  Pulmonary:     Effort: Pulmonary effort is normal.  Skin:    Comments: Erythematous macular rash under left nare with appearance of fine scaling extending 1cm out from nares.   Neurological:     Mental Status: She is alert and oriented to person, place, and time.  Psychiatric:        Mood and Affect: Mood normal.  Assessment & Plan:  SABRASABRADemiah Gullickson was seen today for medical management of chronic issues.  Diagnoses and all orders for this visit:  Seborrheic dermatitis -     clotrimazole -betamethasone  (LOTRISONE ) cream; Apply 1 Application topically daily. As needed to areas of rash under nose.  Poor concentration -     buPROPion  (WELLBUTRIN  XL) 150 MG 24 hr tablet; Take 1 tablet (150 mg total) by mouth every morning.  Depressed mood -     buPROPion  (WELLBUTRIN  XL) 150 MG 24 hr tablet; Take 1 tablet (150 mg total) by mouth every morning.  Anxiety -     buPROPion  (WELLBUTRIN  XL) 150 MG 24 hr tablet; Take 1 tablet (150 mg total) by mouth every morning.   Rash appears to be seborrheic dermatitis HO given Use lotrisone  cream as needed  Encouraged to use selsun blue as needed around nasal folds Follow up if not improving or worsening  PHQ/GAD improved Ok to stop zoloft  and will switch SR wellbutrin  formula to XL Follow up in 3 months  Vermell Bologna, PA-C

## 2024-07-08 NOTE — Progress Notes (Signed)
   Acute Office Visit  Subjective:     Patient ID: Olivia Gardner, female    DOB: 1991-05-15, 33 y.o.   MRN: [id number]  Chief Complaint  Patient presents with   Medical Management of Chronic Issues   Patient is in today for seborrheic dermatitis that has been present under her nasolabial folds for multiple weeks. Patient states she has used antibiotic ointment, face wash, and leaving the area alone to try and make this better with no relief. Patient states it can occasionally be tender and can also appear as dry skin at times. Patient states that today the redness is much better than usual, but provided photos of when it is irritated.   Patient also requests to stop her Zoloft  today and to possibly start taking the Wellbutrin  once a day instead of twice daily due to forgetting to take her second dose throughout the day at times. Patient's stress levels have otherwise been well managed. She has no additional concerns today.   Review of Systems  All other systems reviewed and are negative.      Objective:    BP 135/75   Pulse 90   Ht 5' 5 (1.651 m)   Wt 59 kg   SpO2 100%   BMI 21.63 kg/m     Physical Exam Constitutional:      Appearance: Normal appearance.  HENT:     Head: Normocephalic and atraumatic.     Nose: Nose normal.     Comments: Mild erythema at the left nasolabial fold.  Musculoskeletal:     Cervical back: Normal range of motion.  Skin:    General: Skin is warm.  Neurological:     General: No focal deficit present.     Mental Status: She is alert and oriented to person, place, and time.     No results found for any visits on 07/08/24.      Assessment & Plan:   Problem List Items Addressed This Visit       Musculoskeletal and Integument   Seborrheic dermatitis - Primary     Other   Depressed mood   Anxiety   Relevant Medications   buPROPion  (WELLBUTRIN  XL) 150 MG 24 hr tablet   Poor concentration    Meds ordered this encounter   Medications   buPROPion  (WELLBUTRIN  XL) 150 MG 24 hr tablet    Sig: Take 1 tablet (150 mg total) by mouth every morning.    Dispense:  90 tablet    Refill:  0    Supervising Provider:   METHENEY, CATHERINE D [[id number]]   clotrimazole -betamethasone  (LOTRISONE ) cream    Sig: Apply 1 Application topically daily. As needed to areas of rash under nose.    Dispense:  30 g    Refill:  0    Supervising Provider:   METHENEY, CATHERINE D [[id number]]   Seborrheic Dermatitis - Sent Lotrisone  cream to the pharmacy to applied to affected area under nose daily.  Depressed mood Anxiety Decreased concentration  - Stop taking Zoloft   - Stop taking Wellbutrin  100mg  SR - Start taking Wellbutrin  XL 150mg  daily  - Discussed caffeine intake of less than 200mg  per day as a goal (pt already is below this number), and discussed how higher levels of caffeine intake can cause day time sleepiness.   No follow-ups on file.  Tinnie FORBES Patient, Student-PA

## 2024-07-09 ENCOUNTER — Encounter: Payer: Self-pay | Admitting: Physician Assistant

## 2024-09-09 ENCOUNTER — Telehealth: Admitting: Physician Assistant

## 2024-09-09 ENCOUNTER — Encounter: Payer: Self-pay | Admitting: Physician Assistant

## 2024-09-09 DIAGNOSIS — J302 Other seasonal allergic rhinitis: Secondary | ICD-10-CM

## 2024-09-09 DIAGNOSIS — J0140 Acute pansinusitis, unspecified: Secondary | ICD-10-CM

## 2024-09-09 MED ORDER — AMOXICILLIN-POT CLAVULANATE 875-125 MG PO TABS: 1.0000 | ORAL_TABLET | Freq: Two times a day (BID) | ORAL | 0 refills | Status: DC

## 2024-09-09 NOTE — Patient Instructions (Signed)

## 2024-09-09 NOTE — Progress Notes (Unsigned)
..  Virtual Visit via Video Note  I connected with [name] on 09/09/24 at 11:30 AM EDT by a video enabled telemedicine application and verified that I am speaking with the correct person using two identifiers.  Location: Patient: work Provider: clinic  .SABRAParticipating in visit:  Patient: Olivia Gardner Provider: Vermell Bologna PA-C    I discussed the limitations of evaluation and management by telemedicine and the availability of in person appointments. The patient expressed understanding and agreed to proceed.  History of Present Illness: Pt is a 33 yo female with 5 days of sinus pressure, ear pressure, sinus drainage, nasal congestion. She is a Runner, broadcasting/film/video with lots of sick contacts. She gets a sinus infection every fall when her seasonal allergies flare.  She has not had covid or flu shot this year. She has not tested for either at home. She denies any fever, chills, body aches, SOB. She is taking tylenol  cold sinus severe with some minimal benefit.     Observations/Objective: No acute distress  Maxillary and frontal sinuses tender to palpation with maxillary sinuses worse Normal breathing Normal mood and appearance   Assessment and Plan: SABRASABRADiagnoses and all orders for this visit:  Acute non-recurrent pansinusitis -     amoxicillin -clavulanate (AUGMENTIN ) 875-125 MG tablet; Take 1 tablet by mouth 2 (two) times daily.  Seasonal allergies   Start augmentin  with flonase  Consider nasal sinus rinses Make sure to stay hydrated Follow up as needed if symptoms persist or worsen    Follow Up Instructions:    I discussed the assessment and treatment plan with the patient. The patient was provided an opportunity to ask questions and all were answered. The patient agreed with the plan and demonstrated an understanding of the instructions.   The patient was advised to call back or seek an in-person evaluation if the symptoms worsen or if the condition fails to improve as  anticipated.  Kandis Henry, PA-C

## 2024-09-25 ENCOUNTER — Encounter: Payer: Self-pay | Admitting: Physician Assistant

## 2024-09-25 ENCOUNTER — Ambulatory Visit: Admitting: Physician Assistant

## 2024-09-25 VITALS — BP 122/72 | HR 66 | Temp 98.1°F

## 2024-09-25 DIAGNOSIS — N3281 Overactive bladder: Secondary | ICD-10-CM | POA: Diagnosis not present

## 2024-09-25 DIAGNOSIS — K115 Sialolithiasis: Secondary | ICD-10-CM | POA: Diagnosis not present

## 2024-09-25 DIAGNOSIS — K120 Recurrent oral aphthae: Secondary | ICD-10-CM

## 2024-09-25 MED ORDER — PREDNISONE 20 MG PO TABS: 20.0000 mg | ORAL_TABLET | Freq: Every day | ORAL | 0 refills | Status: AC

## 2024-09-25 MED ORDER — TRIAMCINOLONE ACETONIDE 0.1 % MT PSTE: 1.0000 | PASTE | Freq: Three times a day (TID) | OROMUCOSAL | 1 refills | Status: AC

## 2024-09-25 NOTE — Progress Notes (Signed)
 Acute Office Visit  Subjective:     Patient ID: AGAM TUOHY, female    DOB: [birth year], 33 y.o.   MRN: [id number]  Chief Complaint  Patient presents with   Medical Management of Chronic Issues    HPI Discussed the use of AI scribe software for clinical note transcription with the patient, who gave verbal consent to proceed.  History of Present Illness Olivia Gardner is a 33 year old female who presents with a large canker sore on the inside of her right bottom lip.  Oral ulceration and associated pain - Large canker sore on the inside of her right bottom lip, described as almost a wound - Onset of symptoms around last Wednesday with progressive worsening over the week - Initial trauma from biting the cheek, leading to swelling and repeated trauma to the same area - Significant pain, with throbbing sensation in the face, especially at night - Pain severe enough to interfere with talking and daily functioning - Area feels warmer to the touch, but no fever or chills - Presence of a hard, noticeable knot in the cheek on palpation - Swelling has decreased since yesterday  Pain management and symptom relief measures - Using salt water rinses and over-the-counter canker sore treatment, exceeding recommended four times daily due to pain - Tylenol  used for pain management, but pain persists, especially at night - Avoids ibuprofen    Dietary modifications - Eating soup to avoid aggravating the sore  She mentions her insurance changing and wanting another referral to pelvic floor PT for OAB.    ROS  See HPI.     Objective:    BP 122/72 (Cuff Size: Normal)   Pulse 66   Temp 98.1 F (36.7 C)   SpO2 100%  BP Readings from Last 3 Encounters:  09/25/24 122/72  07/08/24 135/75  03/28/24 117/74   Wt Readings from Last 3 Encounters:  07/08/24 130 lb (59 kg)  03/28/24 131 lb (59.4 kg)  03/08/24 129 lb (58.5 kg)      Physical Exam Constitutional:      Appearance:  Normal appearance.  HENT:     Head: Normocephalic.     Comments: Slightly red and warm right cheek and jaw with mild tenderness.     Right Ear: Tympanic membrane, ear canal and external ear normal. There is no impacted cerumen.     Left Ear: Tympanic membrane, ear canal and external ear normal. There is no impacted cerumen.     Nose: Nose normal. No congestion or rhinorrhea.     Mouth/Throat:     Mouth: Mucous membranes are moist.     Pharynx: No oropharyngeal exudate or posterior oropharyngeal erythema.     Comments: Right lower lip just inside 2cm by 1.5cm ulceration.  Eyes:     Conjunctiva/sclera: Conjunctivae normal.  Cardiovascular:     Rate and Rhythm: Normal rate.  Pulmonary:     Effort: Pulmonary effort is normal.  Musculoskeletal:     Cervical back: Normal range of motion and neck supple. No tenderness.  Lymphadenopathy:     Cervical: No cervical adenopathy.  Neurological:     Mental Status: She is alert and oriented to person, place, and time.  Psychiatric:        Mood and Affect: Mood normal.          Assessment & Plan:  .Olivia Gardner was seen today for medical management of chronic issues.  Diagnoses and all orders for this visit:  Canker sores  oral -     predniSONE  (DELTASONE ) 20 MG tablet; Take 1 tablet (20 mg total) by mouth daily with breakfast. -     triamcinolone  (KENALOG ) 0.1 % paste; Use as directed 1 Application in the mouth or throat 3 (three) times daily.  Salivary stone  OAB (overactive bladder) -     Ambulatory referral to Physical Therapy   Assessment & Plan Oral ulcer (canker sore) with cheek trauma Large oral ulcer from repeated cheek biting, causing significant discomfort and sleep disturbances. Swelling decreased, but pain persists, especially at night. No fever or chills. Tolerates steroids well. - Prescribed topical triamcinolone  paste for pain relief and to aid with healing up to three times a day for next week or two.  - Continue Orajel  for pain control. - Continue salt water gargles. - Prescribed short course of prednisone  to reduce inflammation and speed healing. - Advised against ibuprofen  use with prednisone  - Recommended icing face and applying ice cube directly on ulcer for pain relief. - Advised soft diet to avoid exacerbating ulcer.  Suspected salivary stone (sialolithiasis) Hard knot in cheek area, likely salivary stone, causing discomfort and drooling. - Recommended icing face to reduce swelling and discomfort.    Amarachukwu Lakatos, PA-C

## 2024-09-25 NOTE — Patient Instructions (Addendum)
 Start prednisone  for 5 days.  Use triamcinolone  up to three times a day.  Salivary Stone  A salivary stone is a small cluster of mineral (mineral deposit) that builds up in the tubes (ducts) that drain the salivary glands. Most salivary stones are made of calcium. When a stone forms, saliva can back up into the gland and cause painful swelling. Your salivary glands are the glands that make saliva. You have six major salivary glands. Each gland has a duct that carries saliva into your mouth. Saliva keeps your mouth moist and breaks down the food that you eat. It also helps prevent tooth decay. Two salivary glands are found just in front of your ears (parotid). The ducts for these glands open up inside your cheeks, near your back teeth. You also have two glands under your tongue (sublingual) and two glands under your jaw (submandibular). The ducts for these glands open under your tongue. A stone can form in any salivary gland. The most common place for a salivary stone to form is in a submandibular salivary gland. What are the causes? Salivary stones may be caused by any condition that lessens the flow of saliva. It is not known why some people get stones. What increases the risk? You are more likely to develop this condition if: You do not drink enough water. You smoke. You have any of these: High blood pressure. Gout. Diabetes. What are the signs or symptoms? The main sign of a salivary stone is sudden swelling of a salivary gland during eating. This usually happens under the jaw on one side. Other signs and symptoms may include: Swelling of the cheek or under the tongue during eating. Pain in the swollen area. Trouble chewing or swallowing. Swelling that goes down after eating. Sometimes, the salivary stone may be seen. The stone is oval in shape and may be white or yellow in color. How is this diagnosed? This condition may be diagnosed based on: Your signs and symptoms. A physical exam.  In many cases, your health care provider will be able to feel the stone in a duct inside your mouth. Imaging studies, such as: X-rays. Ultrasound. CT scan. MRI. You may need to see an ear, nose, and throat specialist (ENT or otolaryngologist) for diagnosis and treatment. How is this treated? Treatment for this condition depends on the size of the stone. A small stone that is not causing symptoms may be treated with home care. A stone that is large enough to cause symptoms may be treated by: Probing and widening of the duct to let the stone pass. Putting a thin, flexible scope (endoscope) into the duct to find and remove the stone. Breaking up the stone with sound waves. Removing the whole salivary gland. Follow these instructions at home: To relieve discomfort Take NSAIDs, such as ibuprofen , to help relieve pain and swelling as told by your health care provider. Follow these instructions every few hours: Suck on a lemon candy or a vitamin C lozenge to prompt the flow of saliva. Put a warm, damp cloth (warmcompress) over the gland. Gently massage the gland. General instructions  Take over-the-counter and prescription medicines only as told by your health care provider. Drink enough fluid to keep your urine pale yellow. Do not use any products that contain nicotine or tobacco. These products include cigarettes, chewing tobacco, and vaping devices, such as e-cigarettes. If you need help quitting, ask your health care provider. Keep all follow-up visits. This is important. Contact a health care provider  if: You have pain and swelling in your face, jaw, or mouth after eating. You keep having swelling in any of these places: In front of your ear. Under your jaw. Inside your mouth. Get help right away if: You have pain and swelling in your face, jaw, or mouth, that suddenly gets worse. Your pain and swelling make it hard to swallow, talk, or breathe. These symptoms may be an emergency.  Get help right away. Call 911. Do not wait to see if the symptoms will go away. Do not drive yourself to the hospital. Summary A salivary stone is a small clump of mineral (mineral deposit) that builds up in the ducts that drain your salivary glands. When a stone forms, saliva can back up into the gland and cause painful swelling. Salivary stones may be caused by any condition that lessens the flow of saliva. Treatment for this condition depends on the size of the stone. This information is not intended to replace advice given to you by your health care provider. Make sure you discuss any questions you have with your health care provider. Document Revised: 12/08/2021 Document Reviewed: 12/08/2021 [employer] Patient Education  2024 [employer].

## 2024-10-05 ENCOUNTER — Other Ambulatory Visit: Payer: Self-pay | Admitting: Physician Assistant

## 2024-10-05 DIAGNOSIS — F419 Anxiety disorder, unspecified: Secondary | ICD-10-CM

## 2024-10-05 DIAGNOSIS — R41840 Attention and concentration deficit: Secondary | ICD-10-CM

## 2024-10-05 DIAGNOSIS — R4589 Other symptoms and signs involving emotional state: Secondary | ICD-10-CM

## 2024-12-16 ENCOUNTER — Encounter: Payer: Self-pay | Admitting: Physician Assistant

## 2024-12-16 MED ORDER — OSELTAMIVIR PHOSPHATE 75 MG PO CAPS: ORAL_CAPSULE | ORAL | 0 refills | Status: AC

## 2024-12-31 ENCOUNTER — Ambulatory Visit: Payer: Self-pay

## 2025-01-07 ENCOUNTER — Other Ambulatory Visit: Payer: Self-pay | Admitting: Physician Assistant

## 2025-01-07 DIAGNOSIS — F419 Anxiety disorder, unspecified: Secondary | ICD-10-CM

## 2025-01-07 DIAGNOSIS — R41840 Attention and concentration deficit: Secondary | ICD-10-CM

## 2025-01-07 DIAGNOSIS — R4589 Other symptoms and signs involving emotional state: Secondary | ICD-10-CM

## 2025-03-11 ENCOUNTER — Encounter: Admitting: Physician Assistant
